# Patient Record
Sex: Female | Born: 1935 | Race: White | Hispanic: No | Smoking: Never smoker
Health system: Southern US, Community
[De-identification: ages and names within clinical notes are randomized; demographics above are authoritative.]

## PROBLEM LIST (undated history)

## (undated) DIAGNOSIS — Z923 Personal history of irradiation: Secondary | ICD-10-CM

## (undated) DIAGNOSIS — I509 Heart failure, unspecified: Secondary | ICD-10-CM

## (undated) DIAGNOSIS — I4891 Unspecified atrial fibrillation: Secondary | ICD-10-CM

## (undated) DIAGNOSIS — T4145XA Adverse effect of unspecified anesthetic, initial encounter: Secondary | ICD-10-CM

## (undated) DIAGNOSIS — C801 Malignant (primary) neoplasm, unspecified: Secondary | ICD-10-CM

## (undated) DIAGNOSIS — I10 Essential (primary) hypertension: Secondary | ICD-10-CM

## (undated) DIAGNOSIS — Z8669 Personal history of other diseases of the nervous system and sense organs: Secondary | ICD-10-CM

## (undated) DIAGNOSIS — R0602 Shortness of breath: Secondary | ICD-10-CM

## (undated) DIAGNOSIS — Z87442 Personal history of urinary calculi: Secondary | ICD-10-CM

## (undated) DIAGNOSIS — Z95828 Presence of other vascular implants and grafts: Secondary | ICD-10-CM

## (undated) DIAGNOSIS — G4730 Sleep apnea, unspecified: Secondary | ICD-10-CM

## (undated) DIAGNOSIS — D49519 Neoplasm of unspecified behavior of unspecified kidney: Secondary | ICD-10-CM

## (undated) DIAGNOSIS — T8859XA Other complications of anesthesia, initial encounter: Secondary | ICD-10-CM

## (undated) DIAGNOSIS — G629 Polyneuropathy, unspecified: Secondary | ICD-10-CM

## (undated) DIAGNOSIS — R351 Nocturia: Secondary | ICD-10-CM

## (undated) DIAGNOSIS — E119 Type 2 diabetes mellitus without complications: Secondary | ICD-10-CM

## (undated) DIAGNOSIS — M1990 Unspecified osteoarthritis, unspecified site: Secondary | ICD-10-CM

## (undated) HISTORY — DX: Malignant (primary) neoplasm, unspecified: C80.1

## (undated) HISTORY — DX: Essential (primary) hypertension: I10

## (undated) HISTORY — PX: REPLACEMENT TOTAL KNEE BILATERAL: SUR1225

## (undated) HISTORY — DX: Personal history of other diseases of the nervous system and sense organs: Z86.69

## (undated) HISTORY — DX: Type 2 diabetes mellitus without complications: E11.9

## (undated) HISTORY — DX: Unspecified osteoarthritis, unspecified site: M19.90

## (undated) HISTORY — PX: ANKLE SURGERY: SHX546

## (undated) HISTORY — PX: HERNIA REPAIR: SHX51

## (undated) HISTORY — DX: Unspecified atrial fibrillation: I48.91

## (undated) HISTORY — DX: Neoplasm of unspecified behavior of unspecified kidney: D49.519

## (undated) HISTORY — DX: Personal history of irradiation: Z92.3

---

## 2010-09-21 HISTORY — PX: TUMOR REMOVAL: SHX12

## 2012-12-20 HISTORY — PX: DILATION AND CURETTAGE OF UTERUS: SHX78

## 2013-03-15 HISTORY — PX: ABDOMINAL HYSTERECTOMY: SHX81

## 2013-03-21 DIAGNOSIS — C801 Malignant (primary) neoplasm, unspecified: Secondary | ICD-10-CM

## 2013-03-21 HISTORY — DX: Malignant (primary) neoplasm, unspecified: C80.1

## 2013-04-17 ENCOUNTER — Ambulatory Visit: Payer: Medicare Other | Attending: Gynecology | Admitting: Gynecology

## 2013-04-17 ENCOUNTER — Encounter: Payer: Self-pay | Admitting: Gynecology

## 2013-04-17 VITALS — BP 130/74 | Temp 98.4°F | Resp 20 | Ht 65.55 in | Wt 260.4 lb

## 2013-04-17 DIAGNOSIS — C649 Malignant neoplasm of unspecified kidney, except renal pelvis: Secondary | ICD-10-CM

## 2013-04-17 DIAGNOSIS — I10 Essential (primary) hypertension: Secondary | ICD-10-CM | POA: Insufficient documentation

## 2013-04-17 DIAGNOSIS — Z794 Long term (current) use of insulin: Secondary | ICD-10-CM | POA: Insufficient documentation

## 2013-04-17 DIAGNOSIS — C541 Malignant neoplasm of endometrium: Secondary | ICD-10-CM

## 2013-04-17 DIAGNOSIS — C549 Malignant neoplasm of corpus uteri, unspecified: Secondary | ICD-10-CM | POA: Insufficient documentation

## 2013-04-17 DIAGNOSIS — Z7901 Long term (current) use of anticoagulants: Secondary | ICD-10-CM | POA: Insufficient documentation

## 2013-04-17 DIAGNOSIS — I4891 Unspecified atrial fibrillation: Secondary | ICD-10-CM | POA: Insufficient documentation

## 2013-04-17 DIAGNOSIS — E119 Type 2 diabetes mellitus without complications: Secondary | ICD-10-CM

## 2013-04-17 NOTE — Patient Instructions (Addendum)
We will schedule a PET scan, consultation with medical and radiation oncology.  I will plan on seeing me back in 2 months.

## 2013-04-17 NOTE — Progress Notes (Signed)
Consult Note: Gyn-Onc   Monica Compton 77 y.o. female  Chief Complaint  Patient presents with  . 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  I discussed with the patient and her daughter treatment options including:  #1 observation  #2 completion surgical staging including peritoneal washings, partial omentectomy, removal of the cervix, and possibly lymphadenectomy. Based on those surgical and pathologic findings adjuvant therapy would be individualized.  #3 proceed immediately to treat her as a high-risk patient with carboplatin and Taxol chemotherapy combined with brachytherapy to the cervix. The pros and cons of the surgery choices were discussed at length in the endocrine to proceed with option #3.  Prior to initiating therapy will obtain a PET CT scan to make more certain where not overlooking more extensive disease and use this as a baseline for later evaluation. We will schedule her to see a medical oncologist in the near future and obtain consultation with radiation oncology as well. All the patient's questions are answered. The patient and her daughter voice clear understanding of our rationale and treatment plan. Plan on seeing the patient back again in approximately 2 months in the middle of her chemotherapy.     HPI: 77 year old white female seen in consultation regarding management of a newly diagnosed endometrial cancer. Review of records show the patient had abnormal bleeding for a number of months. Evaluation has included an endometrial biopsy and D&C neither of which revealed endometrial cancer. However it is noted in the patient's operative note that the [facility] specimen did not show any definitive  endometrial epithelium. Ultimately, because of heavy bleeding, the patient underwent surgery on 03/15/2013. Procedure was a laparoscopic supracervical hysterectomy which required morcellation to remove the uterus. Final pathology revealed a 225 g specimen which was morcellated. It is the pathologist opinion that the aggregate tumor size is approximately 6 cm. Currently depth of invasion could not be determined the tubes and ovaries appeared not to be involved with cancer.  The patient's postoperative course was uncomplicated.  It is noted the patient is a number of medical problems listed below.  Review of Systems:10 point review of systems is negative except as noted in interval history.   Vitals: Blood pressure 130/74, temperature 98.4 F (36.9 C), temperature source Oral, resp. rate 20, height 5' 5.55" (1.665 m), weight 260 lb 6.4 oz (118.117 kg).  Physical Exam: General : The patient is a healthy woman in no acute distress.  HEENT: normocephalic, extraoccular movements normal; neck is supple without thyromegally  Lynphnodes: Supraclavicular and inguinal nodes not enlarged  Abdomen: Soft, non-tender, no ascites, no organomegally, no masses, no hernias , the abdomen is obese and has a well-healed transverse incision in the epigastrium allegedly for a hernia repair. Pelvic:  EGBUS: Normal female  Vagina: Normal, no lesions  Urethra and Bladder: Normal, non-tender  Cervix: Difficult to visualize because the patient's habitus but seems to be normal. Uterus: Surgically absent  Bi-manual examination: Non-tender; no adenxal masses or nodularity  Rectal: normal sphincter tone, no masses, no blood  Lower extremities: No edema or varicosities. Normal range of motion      Allergies  Allergen Reactions  . Statins Itching    Past Medical History  Diagnosis Date  .  Hypertension   . Diabetes mellitus without complication   . Kidney tumor   . Arthritis   . A-fib   . Cancer      Endometrial cancer  . Hx of migraines     Past Surgical History  Procedure Laterality Date  . Tumor removal  2012    Kidney  . Replacement total knee bilateral      10 yrs ago, 29 yrs ago  . Ankle surgery      Current Outpatient Prescriptions  Medication Sig Dispense Refill  . Cholecalciferol (VITAMIN D-3) 1000 UNITS CAPS Take by mouth daily.      . Cyanocobalamin (VITAMIN B-12 PO) Take 1 tablet by mouth daily.      . digoxin (LANOXIN) 0.125 MG tablet Take 0.125 mg by mouth daily. Patients takes on Monday, Wednesday and friday      . ferrous sulfate 325 (65 FE) MG tablet Take 325 mg by mouth daily with breakfast.      . fish oil-omega-3 fatty acids 1000 MG capsule Take 2 g by mouth daily.      Marland Kitchen FOLIC ACID PO Take 1 tablet by mouth daily.      . furosemide (LASIX) 80 MG tablet Take 80 mg by mouth. 1 1/2 tablet daily      . gabapentin (NEURONTIN) 300 MG capsule Take 300 mg by mouth every morning.      . Insulin Glargine (LANTUS [facility]) Inject into the skin. 51 units in the a.m and 40 units in the p.m.      . Insulin Lispro, Human, (HUMALOG [place]) Inject into the skin. 8 units in the a.m.  and 9 units in the p.m.      Marland Kitchen potassium chloride (K-DUR) 10 MEQ tablet Take 10 mEq by mouth 2 (two) times daily.      . rosuvastatin (CRESTOR) 10 MG tablet Take 10 mg by mouth daily.      . traMADol (ULTRAM) 50 MG tablet Take 50 mg by mouth every 6 (six) hours as needed for pain.      . verapamil (CALAN-SR) 180 MG CR tablet Take 180 mg by mouth 2 (two) times daily.      . vitamin C (ASCORBIC ACID) 500 MG tablet Take 500 mg by mouth daily.      Marland Kitchen warfarin (COUMADIN) 5 MG tablet Take 7.5 mg by mouth daily. On Tuesday and Thursday patient takes  1/2 tablet. On Monday, Wednesday and Friday patient takes 7.5mg        No current facility-administered medications for this visit.    History   Social History  . Marital Status: Legally Separated    Spouse Name: N/A    Number of Children: N/A  . Years of  Education: N/A   Occupational History  . Not on file.   Social History Main Topics  . Smoking status: Never Smoker   . Smokeless tobacco: Not on file  . Alcohol Use: No  . Drug Use: No  . Sexually Active: No   Other Topics Concern  . Not on file   Social History Narrative  . No narrative on file    Family History  Problem Relation Age of Onset  . Diabetes Mother   . Stroke Mother   . Atrial fibrillation Mother   . Hypertension Mother   . Diabetes Maternal Grandmother   . Heart attack Father       Jeannette Corpus, MD 04/17/2013, 4:47 PM

## 2013-04-18 ENCOUNTER — Telehealth: Payer: Self-pay | Admitting: Gynecologic Oncology

## 2013-04-18 NOTE — Telephone Encounter (Signed)
Patient informed of upcoming appt with Dr. Roselind Messier on July 31 at 12:30pm.  Verbalizing understanding.  Instructed to call for any needs.  All upcoming appts reviewed.

## 2013-04-19 ENCOUNTER — Encounter: Payer: Self-pay | Admitting: Radiation Oncology

## 2013-04-19 DIAGNOSIS — C801 Malignant (primary) neoplasm, unspecified: Secondary | ICD-10-CM | POA: Insufficient documentation

## 2013-04-20 ENCOUNTER — Ambulatory Visit
Admission: RE | Admit: 2013-04-20 | Discharge: 2013-04-20 | Disposition: A | Discharge status: Home / Self Care | Payer: Medicare Other | Source: Ambulatory Visit | Attending: Radiation Oncology | Admitting: Radiation Oncology

## 2013-04-20 ENCOUNTER — Encounter: Payer: Self-pay | Admitting: Radiation Oncology

## 2013-04-20 ENCOUNTER — Telehealth: Payer: Self-pay | Admitting: Oncology

## 2013-04-20 VITALS — BP 157/61 | HR 70 | Temp 97.9°F | Ht 66.5 in | Wt 264.0 lb

## 2013-04-20 DIAGNOSIS — C541 Malignant neoplasm of endometrium: Secondary | ICD-10-CM

## 2013-04-20 DIAGNOSIS — C801 Malignant (primary) neoplasm, unspecified: Secondary | ICD-10-CM

## 2013-04-20 DIAGNOSIS — Z90710 Acquired absence of both cervix and uterus: Secondary | ICD-10-CM | POA: Insufficient documentation

## 2013-04-20 DIAGNOSIS — C549 Malignant neoplasm of corpus uteri, unspecified: Secondary | ICD-10-CM | POA: Insufficient documentation

## 2013-04-20 HISTORY — DX: Heart failure, unspecified: I50.9

## 2013-04-20 HISTORY — DX: Polyneuropathy, unspecified: G62.9

## 2013-04-20 MED ORDER — RADIAPLEXRX EX GEL: Freq: Once | CUTANEOUS | Status: DC

## 2013-04-20 NOTE — Telephone Encounter (Signed)
CALLED PT TO GAVE NP APPT 08/05 @ 12. PT STATED SHE WOULD NEED A Monday APPT DUE TO TRANSPORTATION ISSUES. MESSAGE SENT TO DR. Welton Flakes.

## 2013-04-20 NOTE — Progress Notes (Signed)
Radiation Oncology         [phone] ________________________________  Initial outpatient Consultation  Name: Monica Compton MRN: [id number]  Date: 04/20/2013  DOB: December 03, 1935  CC:No primary provider on file.  Clarke-Pearson, Reuel Boom ,Raye Sorrow, MD  REFERRING PHYSICIAN: De Blanch *  DIAGNOSIS: Endometrioid adenocarcinoma, FIGO grade 1  HISTORY OF PRESENT ILLNESS::Monica Compton is a 77 y.o. female who is seen out courtesy of Dr. Darlyn Read for an opinion concerning radiation therapy as part of management of the patient's recently diagnosed endometrial cancer.Review of records show the patient had abnormal bleeding for a number of months. Evaluation has included an endometrial biopsy and D&C neither of which revealed endometrial cancer. However it is noted in the patient's operative note that the [facility] specimen did not show any definitive endometrial epithelium. Ultimately, because of heavy bleeding, the patient underwent surgery on 03/15/2013. Procedure was a laparoscopic supracervical hysterectomy which required morcellation to remove the uterus. Intraoperatively the patient was noted to have adhesions along the anterior vaginal wall.  Final pathology revealed a 225 g specimen which was morcellated. It was the pathologist's opinion that the aggregate tumor size is approximately 6 cm.  depth of invasion could not be determined the tubes and ovaries appeared not to be involved with cancer. The patient did well postoperatively. The findings of endometrial cancer were unexpected and therefore a cancer operation was not performed as above. Patient was referred to gynecologic oncology. Since the uterus was morcellated, the patient was felt to be at high risk for intraperitoneal recurrence and consideration for adjuvant chemotherapy was recommended. Was also recommended patient be considered for intracavitary brachytherapy treatments given the risk for cervical  recurrence.   PREVIOUS RADIATION THERAPY: No  PAST MEDICAL HISTORY:  has a past medical history of Hypertension; Diabetes mellitus without complication; Kidney tumor; Arthritis; A-fib; Cancer (03/2013); migraines; CHF (congestive heart failure); and Neuropathy.    PAST SURGICAL HISTORY: Past Surgical History  Procedure Laterality Date  . Tumor removal  2012    Kidney  . Replacement total knee bilateral      10 yrs ago, 29 yrs ago  . Ankle surgery Left   . Abdominal hysterectomy  03/15/2013  . Dilation and curettage of uterus  12/2012  . Hernia repair      x2    FAMILY HISTORY: family history includes Atrial fibrillation in her mother; Diabetes in her maternal grandmother and mother; Heart attack in her father; Hypertension in her mother; and Stroke in her mother.  SOCIAL HISTORY:  reports that she has never smoked. She has never used smokeless tobacco. She reports that she does not drink alcohol or use illicit drugs.  ALLERGIES: Statins  MEDICATIONS:  Current Outpatient Prescriptions  Medication Sig Dispense Refill  . Cholecalciferol (VITAMIN D-3) 1000 UNITS CAPS Take by mouth daily.      . Cyanocobalamin (VITAMIN B-12 PO) Take 1 tablet by mouth daily.      . digoxin (LANOXIN) 0.125 MG tablet Take 0.125 mg by mouth daily. Patients takes on Monday, Wednesday and friday      . ferrous sulfate 325 (65 FE) MG tablet Take 325 mg by mouth daily with breakfast.      . fish oil-omega-3 fatty acids 1000 MG capsule Take 2 g by mouth daily.      Marland Kitchen FOLIC ACID PO Take 1 tablet by mouth daily.      . furosemide (LASIX) 80 MG tablet Take 80 mg by mouth. 1 1/2 tablet daily      .  gabapentin (NEURONTIN) 300 MG capsule Take 300 mg by mouth every morning.      . Insulin Glargine (LANTUS [place]) Inject into the skin. 51 units in the a.m and 40 units in the p.m.      . Insulin Lispro, Human, (HUMALOG [place]) Inject into the skin. 8 units in the a.m.  and 9 units in the p.m.      Marland Kitchen potassium chloride (K-DUR)  10 MEQ tablet Take 10 mEq by mouth 2 (two) times daily.      . rosuvastatin (CRESTOR) 10 MG tablet Take 10 mg by mouth daily.      . traMADol (ULTRAM) 50 MG tablet Take 50 mg by mouth every 6 (six) hours as needed for pain.      . verapamil (CALAN-SR) 180 MG CR tablet Take 180 mg by mouth 2 (two) times daily.      . vitamin C (ASCORBIC ACID) 500 MG tablet Take 500 mg by mouth daily.      Marland Kitchen warfarin (COUMADIN) 5 MG tablet Take 7.5 mg by mouth daily. On Tuesday and Thursday patient takes  1/2 tablet. On Monday, Wednesday and Friday patient takes 7.5mg        No current facility-administered medications for this encounter.    REVIEW OF SYSTEMS:  A 15 point review of systems is documented in the electronic medical record. This was obtained by the nursing staff. However, I reviewed this with the patient to discuss relevant findings and make appropriate changes. The patient presented with a several month history of vaginal bleeding as above. She denies any pain in the pelvis area. .she denies any urination difficulties or bowel complaints since her surgery.   PHYSICAL EXAM:  height is 5' 6.5" (1.689 m) and weight is 264 lb (119.75 kg). Her temperature is 97.9 F (36.6 C). Her blood pressure is 157/61 and her pulse is 70.   BP 157/61  Pulse 70  Temp(Src) 97.9 F (36.6 C)  Ht 5' 6.5" (1.689 m)  Wt 264 lb (119.75 kg)  BMI 41.98 kg/m2  General Appearance:    Alert, cooperative, no distress, appears stated age,  accompanied by her daughter on evaluation today who is healthcare power of attorney   Head:    Normocephalic, without obvious abnormality, atraumatic  Eyes:    PERRL, conjunctiva/corneas clear, EOM's intact       Nose:   Nares normal, septum midline, mucosa normal, no drainage    or sinus tenderness  Throat:   Lips, mucosa, and tongue normal; gums normal  Neck:   Supple, symmetrical, trachea midline, no adenopathy;    thyroid:  no enlargement/tenderness/nodules; no carotid   bruit or JVD   Back:     Symmetric, no curvature, ROM normal, no CVA tenderness  Lungs:     Clear to auscultation bilaterally, respirations unlabored  Chest Wall:    No tenderness or deformity   Heart:    Regular rate and rhythm at this time. Patient does have history of chronic atrial fibrillation      Abdomen:     Soft, non-tender, bowel sounds active all four quadrants,    no masses, no organomegaly, scar from prior surgery   Genitalia:    deferred until simulation and planning day   Rectal:    deferred until simulation and planning day     Extremities:   Extremities normal, atraumatic, some edema in the ankle and foot areas   Pulses:   2+ and symmetric all extremities  Skin:  Skin color, texture, turgor normal, no rashes or lesions  Lymph nodes:   Cervical, supraclavicular, and axillary nodes normal  Neurologic:  normal strength      LABORATORY DATA:  No results found for this basename: WBC, HGB, HCT, MCV, PLT   No results found for this basename: NA, K, CL, CO2   No results found for this basename: ALT, AST, GGT, ALKPHOS, BILITOT     RADIOGRAPHY: PET scan pending    IMPRESSION: Grade 1 endometrial adenocarcinoma found unexpectedly at the time of supracervical hysterectomy for severe uterine bleeding.  A cancer operation was not performed since this was found unexpectedly. The uterus did undergo morcellation which would put her at risk for intraperitoneal recurrence. Patient will see medical oncology for consideration for adjuvant chemotherapy. Patient would appear to be at risk for recurrence along the cervical area. The pathologic margins along the cervical area from her hysterectomy cannot be determined.  In light of these findings I would recommend intracavitary brachytherapy treatments.  I discussed low dose rate cesium treatment versus high-dose rate iridium 192 treatments. Patient wishes to pursue high-dose rate treatments and to avoid a lengthy admission with low dose rate radiation  treatments.  Patient understands that she will likely undergo anesthesia for each application given her body habitus. I would anticipate that the patient may be treated with a short tandem placed within the cervix region along with a ring applicator along the proximal vagina.    PLAN: Patient will be evaluated by medical oncology in the near future. In light of the patient's age and medical issues I am unsure whether she could tolerate adjuvant chemotherapy at the same time as weekly brachytherapy treatments.  We may need to consider "sandwich therapy" with 3 cycles of chemotherapy followed by Weekly HDR radiation applications  and then 3 additional cycles of chemotherapy.   I spent 60 minutes minutes face to face with the patient and more than 50% of that time was spent in counseling and/or coordination of care.   ------------------------------------------------  -----------------------------------  Billie Lade, PhD, MD

## 2013-04-20 NOTE — Progress Notes (Signed)
GYN Location of Tumor / Histology: endometrium  Patient presented with abnormal bleeding for a number of months Evaluation has included an endometrial biopsy and D&C neither of which revealed endometrial cancer. However it is noted in the patient's operative note that the [facility] specimen did not show any definitive endometrial epithelium. Ultimately, because of heavy bleeding, the patient underwent surgery on 03/15/2013. Procedure was a laparoscopic supracervical hysterectomy which required morcellation to remove the uterus. Final pathology revealed a 225 g specimen which was morcellated. It is the pathologist opinion that the aggregate tumor size is approximately 6 cm. Currently depth of invasion could not be determined the tubes and ovaries appeared not to be involved with cancer.  Past/Anticipated interventions by medical oncology, if any: proceed immediately to treat her as a high-risk patient with carboplatin and Taxol chemotherapy combined with brachytherapy to the cervix.  Weight changes, if any: None noted  Bowel/Bladder complaints, if any: Nodes not enlarged, bladder normal and nontender, habitus seems to be normal bue difficulty to visualize  Nausea/Vomiting, if any: None noted  Pain issues, if any:  None noted  SAFETY ISSUES:  Prior radiation? NO  Pacemaker/ICD? NO  Possible current pregnancy? NO  Is the patient on methotrexate? NO  Current Complaints / other details:  77 year old female. Legally seperated  Grade 1 endometrial cancer status post supracervical hysterectomy with morcellation  AX: statins

## 2013-04-20 NOTE — Progress Notes (Addendum)
Monica Compton here with her daughter for consult for endometrial cancer.  She denies pain.  She does have "nervous nausea."  She denies any vaginal discharge. She is seeing Dr. Welton Flakes and is not sure when she will start chemotherapy.

## 2013-04-20 NOTE — Progress Notes (Signed)
Please see the Nurse Progress Note in the MD Initial Consult Encounter for this patient. 

## 2013-04-21 ENCOUNTER — Telehealth: Payer: Self-pay | Admitting: Gynecologic Oncology

## 2013-04-21 ENCOUNTER — Telehealth: Payer: Self-pay | Admitting: Oncology

## 2013-04-21 ENCOUNTER — Encounter (HOSPITAL_COMMUNITY)
Admission: RE | Admit: 2013-04-21 | Discharge: 2013-04-21 | Disposition: A | Discharge status: Home / Self Care | Payer: Medicare Other | Source: Ambulatory Visit | Attending: Gynecologic Oncology | Admitting: Gynecologic Oncology

## 2013-04-21 DIAGNOSIS — C541 Malignant neoplasm of endometrium: Secondary | ICD-10-CM

## 2013-04-21 DIAGNOSIS — Z90710 Acquired absence of both cervix and uterus: Secondary | ICD-10-CM | POA: Insufficient documentation

## 2013-04-21 DIAGNOSIS — C549 Malignant neoplasm of corpus uteri, unspecified: Secondary | ICD-10-CM | POA: Insufficient documentation

## 2013-04-21 MED ORDER — FLUDEOXYGLUCOSE F - 18 (FDG) INJECTION
18.3000 | Freq: Once | INTRAVENOUS | Status: AC | PRN
  Administered 2013-04-21: 18.3 via INTRAVENOUS

## 2013-04-21 NOTE — Telephone Encounter (Signed)
C/D 04/21/13 for appt. 04/25/13

## 2013-04-21 NOTE — Telephone Encounter (Signed)
Daughter notified of PET scan results.  Message left for the patient at home with the results.  Instructed to call for any concerns.

## 2013-04-24 ENCOUNTER — Other Ambulatory Visit: Payer: Self-pay | Admitting: Medical Oncology

## 2013-04-24 DIAGNOSIS — C541 Malignant neoplasm of endometrium: Secondary | ICD-10-CM

## 2013-04-25 ENCOUNTER — Other Ambulatory Visit: Payer: Medicaid Other | Admitting: Lab

## 2013-04-25 ENCOUNTER — Ambulatory Visit (HOSPITAL_BASED_OUTPATIENT_CLINIC_OR_DEPARTMENT_OTHER): Payer: Medicare Other | Admitting: Oncology

## 2013-04-25 ENCOUNTER — Telehealth: Payer: Self-pay | Admitting: Oncology

## 2013-04-25 ENCOUNTER — Ambulatory Visit: Payer: Self-pay

## 2013-04-25 ENCOUNTER — Other Ambulatory Visit (HOSPITAL_BASED_OUTPATIENT_CLINIC_OR_DEPARTMENT_OTHER): Payer: Medicare Other | Admitting: Lab

## 2013-04-25 VITALS — BP 124/73 | HR 76 | Temp 97.9°F | Resp 20 | Ht 66.5 in | Wt 262.1 lb

## 2013-04-25 DIAGNOSIS — C549 Malignant neoplasm of corpus uteri, unspecified: Secondary | ICD-10-CM

## 2013-04-25 DIAGNOSIS — C541 Malignant neoplasm of endometrium: Secondary | ICD-10-CM

## 2013-04-25 DIAGNOSIS — E119 Type 2 diabetes mellitus without complications: Secondary | ICD-10-CM

## 2013-04-25 DIAGNOSIS — I10 Essential (primary) hypertension: Secondary | ICD-10-CM

## 2013-04-25 DIAGNOSIS — C801 Malignant (primary) neoplasm, unspecified: Secondary | ICD-10-CM

## 2013-04-25 LAB — COMPREHENSIVE METABOLIC PANEL (CC13)
AST: 13 U/L (ref 5–34)
Alkaline Phosphatase: 73 U/L (ref 40–150)
Glucose: 128 mg/dl (ref 70–140)
Potassium: 3.7 mEq/L (ref 3.5–5.1)
Sodium: 148 mEq/L — ABNORMAL HIGH (ref 136–145)
Total Bilirubin: 0.5 mg/dL (ref 0.20–1.20)
Total Protein: 6.6 g/dL (ref 6.4–8.3)

## 2013-04-25 LAB — CBC WITH DIFFERENTIAL/PLATELET
EOS%: 1.9 % (ref 0.0–7.0)
Eosinophils Absolute: 0.2 10*3/uL (ref 0.0–0.5)
LYMPH%: 27.3 % (ref 14.0–49.7)
MCH: 27.4 pg (ref 25.1–34.0)
MCHC: 32.6 g/dL (ref 31.5–36.0)
MCV: 83.9 fL (ref 79.5–101.0)
MONO%: 6.4 % (ref 0.0–14.0)
NEUT#: 6.9 10*3/uL — ABNORMAL HIGH (ref 1.5–6.5)
Platelets: 295 10*3/uL (ref 145–400)
RBC: 4.04 10*6/uL (ref 3.70–5.45)
RDW: 18.2 % — ABNORMAL HIGH (ref 11.2–14.5)

## 2013-04-25 MED ORDER — LORAZEPAM 0.5 MG PO TABS: 0.5000 mg | ORAL_TABLET | Freq: Four times a day (QID) | ORAL | Status: DC | PRN

## 2013-04-25 MED ORDER — PROCHLORPERAZINE MALEATE 10 MG PO TABS: 10.0000 mg | ORAL_TABLET | Freq: Four times a day (QID) | ORAL | Status: DC | PRN

## 2013-04-25 MED ORDER — LIDOCAINE-PRILOCAINE 2.5-2.5 % EX CREA: TOPICAL_CREAM | CUTANEOUS | Status: DC | PRN

## 2013-04-25 MED ORDER — ONDANSETRON HCL 8 MG PO TABS: 8.0000 mg | ORAL_TABLET | Freq: Two times a day (BID) | ORAL | Status: DC

## 2013-04-25 MED ORDER — DEXAMETHASONE 4 MG PO TABS: 8.0000 mg | ORAL_TABLET | Freq: Two times a day (BID) | ORAL | Status: DC

## 2013-04-26 ENCOUNTER — Encounter: Payer: Self-pay | Admitting: Oncology

## 2013-04-26 ENCOUNTER — Other Ambulatory Visit: Payer: Self-pay | Admitting: Oncology

## 2013-04-26 ENCOUNTER — Other Ambulatory Visit: Payer: Self-pay

## 2013-04-28 NOTE — Addendum Note (Signed)
Encounter addended by: Bernell List on: 04/28/2013 12:02 PM<BR>     Documentation filed: Charges VN

## 2013-05-01 ENCOUNTER — Other Ambulatory Visit: Payer: Medicare Other

## 2013-05-01 ENCOUNTER — Encounter: Payer: Self-pay | Admitting: Oncology

## 2013-05-01 NOTE — Progress Notes (Signed)
Made copies of the patient power of attorney papers for the daughters. During her chemo class

## 2013-05-02 ENCOUNTER — Encounter: Payer: Self-pay | Admitting: Oncology

## 2013-05-03 ENCOUNTER — Encounter: Payer: Self-pay | Admitting: *Deleted

## 2013-05-03 NOTE — Progress Notes (Signed)
[facility] Psychosocial Distress Screening Clinical Social Work  Clinical Social Work was referred by distress screening protocol.  The patient scored a 6 on the Psychosocial Distress Thermometer which indicates moderate distress. Clinical Social Worker called patient to assess for distress and other psychosocial needs. Ms. Resh reports feeling somewhat overwhelmed with starting treatment.  She shared she has three daughters and one son, whom are all very supportive.  When she feels overwhelmed, she visits with her neighbors or family.  CSW encouraged patient to attend the GYN support group to meet other women experiencing the cancer journey. Patient plans to contact CSW with any other questions or concerns.   Clinical Social Worker follow up needed: no  If yes, follow up plan:   Kathrin Penner, MSW, LCSW Clinical Social Worker [facility] [phone]

## 2013-05-04 ENCOUNTER — Encounter (HOSPITAL_COMMUNITY): Payer: Self-pay | Admitting: Pharmacy Technician

## 2013-05-04 ENCOUNTER — Other Ambulatory Visit: Payer: Self-pay | Admitting: Radiology

## 2013-05-08 ENCOUNTER — Ambulatory Visit (HOSPITAL_COMMUNITY)
Admission: RE | Admit: 2013-05-08 | Discharge: 2013-05-08 | Disposition: A | Discharge status: Home / Self Care | Payer: Medicare Other | Source: Ambulatory Visit | Attending: Oncology | Admitting: Oncology

## 2013-05-08 ENCOUNTER — Other Ambulatory Visit: Payer: Self-pay | Admitting: Oncology

## 2013-05-08 ENCOUNTER — Encounter (HOSPITAL_COMMUNITY): Payer: Self-pay

## 2013-05-08 DIAGNOSIS — C541 Malignant neoplasm of endometrium: Secondary | ICD-10-CM

## 2013-05-08 DIAGNOSIS — Z7901 Long term (current) use of anticoagulants: Secondary | ICD-10-CM | POA: Insufficient documentation

## 2013-05-08 DIAGNOSIS — I10 Essential (primary) hypertension: Secondary | ICD-10-CM | POA: Insufficient documentation

## 2013-05-08 DIAGNOSIS — E119 Type 2 diabetes mellitus without complications: Secondary | ICD-10-CM | POA: Insufficient documentation

## 2013-05-08 DIAGNOSIS — C549 Malignant neoplasm of corpus uteri, unspecified: Secondary | ICD-10-CM | POA: Insufficient documentation

## 2013-05-08 LAB — CBC
Hemoglobin: 11.7 g/dL — ABNORMAL LOW (ref 12.0–15.0)
MCH: 27.2 pg (ref 26.0–34.0)
MCHC: 32 g/dL (ref 30.0–36.0)
MCV: 85.1 fL (ref 78.0–100.0)
Platelets: 268 10*3/uL (ref 150–400)
RBC: 4.3 MIL/uL (ref 3.87–5.11)

## 2013-05-08 MED ORDER — MIDAZOLAM HCL 2 MG/2ML IJ SOLN
INTRAMUSCULAR | Status: AC | PRN
  Administered 2013-05-08: 2 mg via INTRAVENOUS

## 2013-05-08 MED ORDER — FENTANYL CITRATE 0.05 MG/ML IJ SOLN
INTRAMUSCULAR | Status: AC | PRN
  Administered 2013-05-08: 100 ug via INTRAVENOUS

## 2013-05-08 MED ORDER — CEFAZOLIN SODIUM-DEXTROSE 2-3 GM-% IV SOLR
2.0000 g | Freq: Once | INTRAVENOUS | Status: AC
  Administered 2013-05-08: 2 g via INTRAVENOUS
  Filled 2013-05-08: qty 50

## 2013-05-08 MED ORDER — HEPARIN SOD (PORK) LOCK FLUSH 100 UNIT/ML IV SOLN: 500.0000 [IU] | Freq: Once | INTRAVENOUS | Status: DC

## 2013-05-08 MED ORDER — MIDAZOLAM HCL 2 MG/2ML IJ SOLN
INTRAMUSCULAR | Status: AC
  Filled 2013-05-08: qty 4

## 2013-05-08 MED ORDER — SODIUM CHLORIDE 0.9 % IV SOLN
Freq: Once | INTRAVENOUS | Status: AC
  Administered 2013-05-08: 20 mL/h via INTRAVENOUS

## 2013-05-08 MED ORDER — FENTANYL CITRATE 0.05 MG/ML IJ SOLN
INTRAMUSCULAR | Status: AC
  Filled 2013-05-08: qty 4

## 2013-05-08 NOTE — Procedures (Signed)
R IJ PowerPort No complication No blood loss. See complete dictation in [facility].

## 2013-05-08 NOTE — H&P (Signed)
Chief Complaint: "I'm here for a port" Referring Physician:Khan HPI: Monica Compton is an 77 y.o. female with newly diagnosed endometrial cancer. She is to begin chemotherapy soon and is scheduled today for portacath. PMHx and meds reviewed. Has been off her Coumadin for several days. Denies recent fevers, chills, infectious symptoms such as cough, cold, dysuria, abd pain. She reports healing very well from her surgery.  Past Medical History:  Past Medical History  Diagnosis Date  . Hypertension   . Diabetes mellitus without complication   . Kidney tumor   . Arthritis   . A-fib   . Cancer 03/2013    Endometrial cancer  . Hx of migraines   . CHF (congestive heart failure)   . Neuropathy     Past Surgical History:  Past Surgical History  Procedure Laterality Date  . Tumor removal  2012    Kidney  . Replacement total knee bilateral      10 yrs ago, 29 yrs ago  . Ankle surgery Left   . Abdominal hysterectomy  03/15/2013  . Dilation and curettage of uterus  12/2012  . Hernia repair      x2    Family History:  Family History  Problem Relation Age of Onset  . Diabetes Mother   . Stroke Mother   . Atrial fibrillation Mother   . Hypertension Mother   . Diabetes Maternal Grandmother   . Heart attack Father     Social History:  reports that she has never smoked. She has never used smokeless tobacco. She reports that she does not drink alcohol or use illicit drugs.  Allergies:  Allergies  Allergen Reactions  . Statins Itching  . Sulfur Hives    Medications:   Medication List    ASK your doctor about these medications       dexamethasone 4 MG tablet  Commonly known as:  DECADRON  Take 2 tablets (8 mg total) by mouth 2 (two) times daily with a meal. Take two times a day starting the day after chemotherapy for 3 days.     digoxin 0.125 MG tablet  Commonly known as:  LANOXIN  Take 0.125 mg by mouth every Monday, Wednesday, and Friday.     ferrous sulfate 325 (65  FE) MG tablet  Take 325 mg by mouth daily with breakfast.     fish oil-omega-3 fatty acids 1000 MG capsule  Take 1 g by mouth 2 (two) times daily.     folic acid 1 MG tablet  Commonly known as:  FOLVITE  Take 1 mg by mouth daily.     furosemide 80 MG tablet  Commonly known as:  LASIX  Take 120 mg by mouth daily.     gabapentin 300 MG capsule  Commonly known as:  NEURONTIN  Take 300 mg by mouth every evening.     insulin glargine 100 UNIT/ML injection  Commonly known as:  LANTUS  Inject 40-51 Units into the skin 2 (two) times daily. 51u in am, 40u in pm     insulin lispro 100 UNIT/ML injection  Commonly known as:  HUMALOG  Inject 8-9 Units into the skin 2 (two) times daily. 8u in am, 9u in pm     lidocaine-prilocaine cream  Commonly known as:  EMLA  Apply topically as needed.     LORazepam 0.5 MG tablet  Commonly known as:  ATIVAN  Take 1 tablet (0.5 mg total) by mouth every 6 (six) hours as needed (Nausea or vomiting).  ondansetron 8 MG tablet  Commonly known as:  ZOFRAN  Take 1 tablet (8 mg total) by mouth 2 (two) times daily. Take two times a day starting the day after chemo for 3 days. Then take two times a day as needed for nausea or vomiting.     potassium chloride 10 MEQ tablet  Commonly known as:  K-DUR  Take 10 mEq by mouth 2 (two) times daily.     prochlorperazine 10 MG tablet  Commonly known as:  COMPAZINE  Take 1 tablet (10 mg total) by mouth every 6 (six) hours as needed (Nausea or vomiting).     rosuvastatin 10 MG tablet  Commonly known as:  CRESTOR  Take 10 mg by mouth daily.     traMADol 50 MG tablet  Commonly known as:  ULTRAM  Take 50 mg by mouth 2 (two) times daily as needed for pain.     verapamil 180 MG CR tablet  Commonly known as:  CALAN-SR  Take 180 mg by mouth 2 (two) times daily.     vitamin B-12 1000 MCG tablet  Commonly known as:  CYANOCOBALAMIN  Take 1,000 mcg by mouth daily.     vitamin C 500 MG tablet  Commonly known as:   ASCORBIC ACID  Take 500 mg by mouth daily.     Vitamin D-3 1000 UNITS Caps  Take 1 capsule by mouth daily.     warfarin 7.5 MG tablet  Commonly known as:  COUMADIN  Take 3.75-7.5 mg by mouth daily. 1 tab daily except 0.5 tab on Tues and Thurs        Please HPI for pertinent positives, otherwise complete 10 system ROS negative.  Physical Exam: Temp: 98.3, HR: 63, RR: 16, BP: 113/96, O2: 96%   General Appearance:  Alert, cooperative, no distress, appears stated age  Head:  Normocephalic, without obvious abnormality, atraumatic  ENT: Unremarkable  Neck: Supple, symmetrical, trachea midline  Lungs:   Clear to auscultation bilaterally, no w/r/r, respirations unlabored without use of accessory muscles.  Chest Wall:  No tenderness or deformity  Heart:  Irregular rate and rhythm, S1, S2 normal, no murmur, rub or gallop.  Abdomen:   Soft, non-tender, non distended.  Neurologic: Normal affect, no gross deficits.   Results for orders placed during the hospital encounter of 05/08/13 (from the past 48 hour(s))  CBC     Status: Abnormal   Collection Time    05/08/13 10:00 AM      Result Value Range   WBC 12.2 (*) 4.0 - 10.5 K/uL   RBC 4.30  3.87 - 5.11 MIL/uL   Hemoglobin 11.7 (*) 12.0 - 15.0 g/dL   HCT 81.1  91.4 - 78.2 %   MCV 85.1  78.0 - 100.0 fL   MCH 27.2  26.0 - 34.0 pg   MCHC 32.0  30.0 - 36.0 g/dL   RDW 95.6 (*) 21.3 - 08.6 %   Platelets 268  150 - 400 K/uL  PROTIME-INR     Status: None   Collection Time    05/08/13 10:00 AM      Result Value Range   Prothrombin Time 14.0  11.6 - 15.2 seconds   INR 1.10  0.00 - 1.49   No results found.  Assessment/Plan Endometrial cancer Discussed port placement, risks, complications, use of sedation Labs reviewed. Consent signed in chart.  Brayton El PA-C 05/08/2013, 10:52 AM

## 2013-05-14 NOTE — Progress Notes (Signed)
[facility]  [phone] [phone]  MEDICAL ONCOLOGY - INITIAL CONSULATION    Referral MD Dr. Emmaline Kluver  Reason for Referral: 77 year old female with grade 1 endometrial cancer diagnosed June 2014  Chief Complaint  Patient presents with  . new patient  :  Endometrial cancer FIGO 1  HPI: patient is a 77 year old female who had a recent diagnosis of endometrial cancer. She had been having abnormal bleeding for a number of months. She underwent an evaluation which included endometrial biopsy and D&C. The patient and on 03/15/2013 underwent microscopic supra-cervical hysterectomy which required morcellation to remove the uterus. The final pathology revealed a 6 cm aggregate tumor size. Of invasion could not be determined the tubes and ovaries appeared not to be involved with cancer. Postop course was uncomplicated. She was seen by Dr. Loree Fee who has recommended adjuvant chemotherapy consisting of Taxol and carboplatinum. She is now seen in medical oncology for discussion of these.    Past Medical History  Diagnosis Date  . Hypertension   . Diabetes mellitus without complication   . Kidney tumor   . Arthritis   . A-fib   . Cancer 03/2013    Endometrial cancer  . Hx of migraines   . CHF (congestive heart failure)   . Neuropathy   :  Past Surgical History  Procedure Laterality Date  . Tumor removal  2012    Kidney  . Replacement total knee bilateral      10 yrs ago, 29 yrs ago  . Ankle surgery Left   . Abdominal hysterectomy  03/15/2013  . Dilation and curettage of uterus  12/2012  . Hernia repair      x2  :  Current Outpatient Prescriptions  Medication Sig Dispense Refill  . Cholecalciferol (VITAMIN D-3) 1000 UNITS CAPS Take 1 capsule by mouth daily.       . digoxin (LANOXIN) 0.125 MG tablet Take 0.125 mg by mouth every Monday, Wednesday, and Friday.       . ferrous sulfate 325 (65 FE) MG tablet Take 325 mg by mouth  daily with breakfast.      . fish oil-omega-3 fatty acids 1000 MG capsule Take 1 g by mouth 2 (two) times daily.       . furosemide (LASIX) 80 MG tablet Take 120 mg by mouth daily.       Marland Kitchen gabapentin (NEURONTIN) 300 MG capsule Take 300 mg by mouth every evening.       . potassium chloride (K-DUR) 10 MEQ tablet Take 10 mEq by mouth 2 (two) times daily.      . rosuvastatin (CRESTOR) 10 MG tablet Take 10 mg by mouth daily.      . traMADol (ULTRAM) 50 MG tablet Take 50 mg by mouth 2 (two) times daily as needed for pain.       . verapamil (CALAN-SR) 180 MG CR tablet Take 180 mg by mouth 2 (two) times daily.      . vitamin C (ASCORBIC ACID) 500 MG tablet Take 500 mg by mouth daily.      Marland Kitchen dexamethasone (DECADRON) 4 MG tablet Take 2 tablets (8 mg total) by mouth 2 (two) times daily with a meal. Take two times a day starting the day after chemotherapy for 3 days.  30 tablet  1  . folic acid (FOLVITE) 1 MG tablet Take 1 mg by mouth daily.      . insulin glargine (LANTUS) 100 UNIT/ML injection Inject 40-51  Units into the skin 2 (two) times daily. 51u in am, 40u in pm      . insulin lispro (HUMALOG) 100 UNIT/ML injection Inject 8-9 Units into the skin 2 (two) times daily. 8u in am, 9u in pm      . lidocaine-prilocaine (EMLA) cream Apply topically as needed.  30 g  6  . LORazepam (ATIVAN) 0.5 MG tablet Take 1 tablet (0.5 mg total) by mouth every 6 (six) hours as needed (Nausea or vomiting).  30 tablet  0  . ondansetron (ZOFRAN) 8 MG tablet Take 1 tablet (8 mg total) by mouth 2 (two) times daily. Take two times a day starting the day after chemo for 3 days. Then take two times a day as needed for nausea or vomiting.  30 tablet  1  . prochlorperazine (COMPAZINE) 10 MG tablet Take 1 tablet (10 mg total) by mouth every 6 (six) hours as needed (Nausea or vomiting).  30 tablet  1  . vitamin B-12 (CYANOCOBALAMIN) 1000 MCG tablet Take 1,000 mcg by mouth daily.      Marland Kitchen warfarin (COUMADIN) 7.5 MG tablet Take 3.75-7.5 mg  by mouth daily. 1 tab daily except 0.5 tab on Tues and Thurs       No current facility-administered medications for this visit.     Allergies  Allergen Reactions  . Statins Itching  . Sulfur Hives  :  Family History  Problem Relation Age of Onset  . Diabetes Mother   . Stroke Mother   . Atrial fibrillation Mother   . Hypertension Mother   . Diabetes Maternal Grandmother   . Heart attack Father   :  History   Social History  . Marital Status: Legally Separated    Spouse Name: N/A    Number of Children: 4  . Years of Education: N/A   Occupational History  . Not on file.   Social History Main Topics  . Smoking status: Never Smoker   . Smokeless tobacco: Never Used  . Alcohol Use: No  . Drug Use: No  . Sexual Activity: No   Other Topics Concern  . Not on file   Social History Narrative  . No narrative on file  :  A comprehensive review of systems was negative.  Exam: @IPVITALS @  General:  well-nourished in no acute distress.  Eyes:  no scleral icterus.  ENT:  There were no oropharyngeal lesions.  Neck was without thyromegaly.  Lymphatics:  Negative cervical, supraclavicular or axillary adenopathy.  Respiratory: lungs were clear bilaterally without wheezing or crackles.  Cardiovascular:  Regular rate and rhythm, S1/S2, without murmur, rub or gallop.  There was no pedal edema.  GI:  abdomen was soft, flat, nontender, nondistended, without organomegaly.  Muscoloskeletal:  no spinal tenderness of palpation of vertebral spine.  Skin exam was without echymosis, petichae.  Neuro exam was nonfocal.  Patient was able to get on and off exam table without assistance.  Gait was normal.  Patient was alerted and oriented.  Attention was good.   Language was appropriate.  Mood was normal without depression.  Speech was not pressured.  Thought content was not tangential.     Lab Results  Component Value Date   WBC 12.2* 05/08/2013   HGB 11.7* 05/08/2013   HCT 36.6 05/08/2013    PLT 268 05/08/2013   GLUCOSE 128 04/25/2013   ALT 12 04/25/2013   AST 13 04/25/2013   NA 148* 04/25/2013   K 3.7 04/25/2013   CREATININE 0.9  04/25/2013   BUN 13.0 04/25/2013   CO2 28 04/25/2013   INR 1.10 05/08/2013    Nm Pet Image Initial (pi) Skull Base To Thigh  04/21/2013   *RADIOLOGY REPORT*  Clinical Data: Initial treatment strategy for endometrial carcinoma.  Endometrial carcinoma  NUCLEAR MEDICINE PET SKULL BASE TO THIGH  Fasting Blood Glucose:  162  Technique:  18.3 mCi F-18 FDG was injected intravenously.   CT data was obtained and used for attenuation correction and anatomic localization only.  (This was not acquired as a diagnostic CT examination.) Additional exam technical data entered  on technologist worksheet.  Comparison: None  Findings:  Neck: No hypermetabolic nodes in the neck.  Chest: No hypermetabolic mediastinal or hilar nodes.  No suspicious pulmonary nodules.  Abdomen / Pelvis:Post hysterectomy. Small common iliac and nodes on the right measure less than 10 mm (image 185).  No hypermetabolic pelvic lymph nodes.   No abnormal hypermetabolic activity within the solid organs.  No evidence of abdominal or pelvic hypermetabolic nodes.  Skeleton:No focal hypermetabolic activity to suggest skeletal metastasis.  IMPRESSION: No evidence of local endometrial cancer metastasis or distant metastasis.   Original Report Authenticated By: Genevive Bi, M.D.   Ir Fluoro Guide Cv Line Right  05/08/2013   *RADIOLOGY REPORT*  Clinical Data:Endometrial carcinoma, needs access for chemotherapy.  TUNNELED PORT CATHETER PLACEMENT WITH ULTRASOUND AND FLUOROSCOPIC GUIDANCE  Technique: The procedure, risks, benefits, and alternatives were explained to the patient.  Questions regarding the procedure were encouraged and answered.  The patient understands and consents to the procedure.  As antibiotic prophylaxis, cefazolin 2 grams was ordered pre- procedure and administered intravenously within one hour of incision.   Patency of the right IJ vein was confirmed with ultrasound with image documentation. An appropriate skin site was determined. Skin site was marked. Region was prepped using maximum barrier technique including cap and mask, sterile gown, sterile gloves, large sterile sheet, and Chlorhexidine   as cutaneous antisepsis.   The region was infiltrated locally with 1% lidocaine.  Intravenous Fentanyl and Versed were administered as conscious sedation during continuous cardiorespiratory monitoring by the radiology RN, with a total moderate sedation time of 20 minutes.  Under real-time ultrasound guidance, the right IJ vein was accessed with a 21 gauge micropuncture needle; the needle tip within the vein was confirmed with ultrasound image documentation.   Needle was exchanged over a 018 guidewire for transitional dilator which allowed passage of the [facility] wire into the IVC. Over this, the transitional dilator was exchanged for a 5 [place] MPA catheter. A small incision was made on the right anterior chest wall and a subcutaneous pocket fashioned. The power-injectable port was positioned and its catheter tunneled to the right IJ dermatotomy site. The MPA catheter was exchanged over an Amplatz wire for a peel-away sheath, through which the port catheter, which had been trimmed to the appropriate length, was advanced and positioned under fluoroscopy with its tip at the cavoatrial junction. Spot chest radiograph  confirms good catheter position and no pneumothorax. The pocket was closed with deep interrupted and subcuticular continuous 3-0 Monocryl sutures. The port was  flushed per protocol. The incisions were covered with Dermabond then covered with a sterile dressing. No immediate complication.  Fluoroscopy time: 6 seconds  IMPRESSION Technically successful right IJ power-injectable port catheter placement. Ready for routine use.   Original Report Authenticated By: D. Andria Rhein, MD   Ir US Guide Vasc Access  Right  05/08/2013   *RADIOLOGY REPORT*  Clinical  Data:Endometrial carcinoma, needs access for chemotherapy.  TUNNELED PORT CATHETER PLACEMENT WITH ULTRASOUND AND FLUOROSCOPIC GUIDANCE  Technique: The procedure, risks, benefits, and alternatives were explained to the patient.  Questions regarding the procedure were encouraged and answered.  The patient understands and consents to the procedure.  As antibiotic prophylaxis, cefazolin 2 grams was ordered pre- procedure and administered intravenously within one hour of incision.  Patency of the right IJ vein was confirmed with ultrasound with image documentation. An appropriate skin site was determined. Skin site was marked. Region was prepped using maximum barrier technique including cap and mask, sterile gown, sterile gloves, large sterile sheet, and Chlorhexidine   as cutaneous antisepsis.   The region was infiltrated locally with 1% lidocaine.  Intravenous Fentanyl and Versed were administered as conscious sedation during continuous cardiorespiratory monitoring by the radiology RN, with a total moderate sedation time of 20 minutes.  Under real-time ultrasound guidance, the right IJ vein was accessed with a 21 gauge micropuncture needle; the needle tip within the vein was confirmed with ultrasound image documentation.   Needle was exchanged over a 018 guidewire for transitional dilator which allowed passage of the [facility] wire into the IVC. Over this, the transitional dilator was exchanged for a 5 [place] MPA catheter. A small incision was made on the right anterior chest wall and a subcutaneous pocket fashioned. The power-injectable port was positioned and its catheter tunneled to the right IJ dermatotomy site. The MPA catheter was exchanged over an Amplatz wire for a peel-away sheath, through which the port catheter, which had been trimmed to the appropriate length, was advanced and positioned under fluoroscopy with its tip at the cavoatrial junction. Spot chest  radiograph  confirms good catheter position and no pneumothorax. The pocket was closed with deep interrupted and subcuticular continuous 3-0 Monocryl sutures. The port was  flushed per protocol. The incisions were covered with Dermabond then covered with a sterile dressing. No immediate complication.  Fluoroscopy time: 6 seconds  IMPRESSION Technically successful right IJ power-injectable port catheter placement. Ready for routine use.   Original Report Authenticated By: D. Andria Rhein, MD    Pathology:  Nm Pet Image Initial (pi) Skull Base To Thigh  04/21/2013   *RADIOLOGY REPORT*  Clinical Data: Initial treatment strategy for endometrial carcinoma.  Endometrial carcinoma  NUCLEAR MEDICINE PET SKULL BASE TO THIGH  Fasting Blood Glucose:  162  Technique:  18.3 mCi F-18 FDG was injected intravenously.   CT data was obtained and used for attenuation correction and anatomic localization only.  (This was not acquired as a diagnostic CT examination.) Additional exam technical data entered  on technologist worksheet.  Comparison: None  Findings:  Neck: No hypermetabolic nodes in the neck.  Chest: No hypermetabolic mediastinal or hilar nodes.  No suspicious pulmonary nodules.  Abdomen / Pelvis:Post hysterectomy. Small common iliac and nodes on the right measure less than 10 mm (image 185).  No hypermetabolic pelvic lymph nodes.   No abnormal hypermetabolic activity within the solid organs.  No evidence of abdominal or pelvic hypermetabolic nodes.  Skeleton:No focal hypermetabolic activity to suggest skeletal metastasis.  IMPRESSION: No evidence of local endometrial cancer metastasis or distant metastasis.   Original Report Authenticated By: Genevive Bi, M.D.   Ir Fluoro Guide Cv Line Right  05/08/2013   *RADIOLOGY REPORT*  Clinical Data:Endometrial carcinoma, needs access for chemotherapy.  TUNNELED PORT CATHETER PLACEMENT WITH ULTRASOUND AND FLUOROSCOPIC GUIDANCE  Technique: The procedure, risks, benefits, and  alternatives were explained to the patient.  Questions regarding the procedure were encouraged and answered.  The patient understands and consents to the procedure.  As antibiotic prophylaxis, cefazolin 2 grams was ordered pre- procedure and administered intravenously within one hour of incision.  Patency of the right IJ vein was confirmed with ultrasound with image documentation. An appropriate skin site was determined. Skin site was marked. Region was prepped using maximum barrier technique including cap and mask, sterile gown, sterile gloves, large sterile sheet, and Chlorhexidine   as cutaneous antisepsis.   The region was infiltrated locally with 1% lidocaine.  Intravenous Fentanyl and Versed were administered as conscious sedation during continuous cardiorespiratory monitoring by the radiology RN, with a total moderate sedation time of 20 minutes.  Under real-time ultrasound guidance, the right IJ vein was accessed with a 21 gauge micropuncture needle; the needle tip within the vein was confirmed with ultrasound image documentation.   Needle was exchanged over a 018 guidewire for transitional dilator which allowed passage of the [facility] wire into the IVC. Over this, the transitional dilator was exchanged for a 5 [place] MPA catheter. A small incision was made on the right anterior chest wall and a subcutaneous pocket fashioned. The power-injectable port was positioned and its catheter tunneled to the right IJ dermatotomy site. The MPA catheter was exchanged over an Amplatz wire for a peel-away sheath, through which the port catheter, which had been trimmed to the appropriate length, was advanced and positioned under fluoroscopy with its tip at the cavoatrial junction. Spot chest radiograph  confirms good catheter position and no pneumothorax. The pocket was closed with deep interrupted and subcuticular continuous 3-0 Monocryl sutures. The port was  flushed per protocol. The incisions were covered with Dermabond  then covered with a sterile dressing. No immediate complication.  Fluoroscopy time: 6 seconds  IMPRESSION Technically successful right IJ power-injectable port catheter placement. Ready for routine use.   Original Report Authenticated By: D. Andria Rhein, MD   Ir US Guide Vasc Access Right  05/08/2013   *RADIOLOGY REPORT*  Clinical Data:Endometrial carcinoma, needs access for chemotherapy.  TUNNELED PORT CATHETER PLACEMENT WITH ULTRASOUND AND FLUOROSCOPIC GUIDANCE  Technique: The procedure, risks, benefits, and alternatives were explained to the patient.  Questions regarding the procedure were encouraged and answered.  The patient understands and consents to the procedure.  As antibiotic prophylaxis, cefazolin 2 grams was ordered pre- procedure and administered intravenously within one hour of incision.  Patency of the right IJ vein was confirmed with ultrasound with image documentation. An appropriate skin site was determined. Skin site was marked. Region was prepped using maximum barrier technique including cap and mask, sterile gown, sterile gloves, large sterile sheet, and Chlorhexidine   as cutaneous antisepsis.   The region was infiltrated locally with 1% lidocaine.  Intravenous Fentanyl and Versed were administered as conscious sedation during continuous cardiorespiratory monitoring by the radiology RN, with a total moderate sedation time of 20 minutes.  Under real-time ultrasound guidance, the right IJ vein was accessed with a 21 gauge micropuncture needle; the needle tip within the vein was confirmed with ultrasound image documentation.   Needle was exchanged over a 018 guidewire for transitional dilator which allowed passage of the [facility] wire into the IVC. Over this, the transitional dilator was exchanged for a 5 [place] MPA catheter. A small incision was made on the right anterior chest wall and a subcutaneous pocket fashioned. The power-injectable port was positioned and its catheter tunneled to the  right IJ dermatotomy site. The MPA catheter was  exchanged over an Amplatz wire for a peel-away sheath, through which the port catheter, which had been trimmed to the appropriate length, was advanced and positioned under fluoroscopy with its tip at the cavoatrial junction. Spot chest radiograph  confirms good catheter position and no pneumothorax. The pocket was closed with deep interrupted and subcuticular continuous 3-0 Monocryl sutures. The port was  flushed per protocol. The incisions were covered with Dermabond then covered with a sterile dressing. No immediate complication.  Fluoroscopy time: 6 seconds  IMPRESSION Technically successful right IJ power-injectable port catheter placement. Ready for routine use.   Original Report Authenticated By: D. Andria Rhein, MD    Assessment and Plan: 77 year old female with new diagnosis of grade 1 endometrial cancer status post supracervical hysterectomy would morcellation. Patient's tumor measured estimate of 6 cm. Dr. Loree Fee felt that patient was at high risk for intraperitoneal recurrence. Because of this he discussed several treatment options including chemotherapy. She is now seen in medical oncology for discussion of this. A total of 6 cycles of Taxol and carboplatinum are planned. Combined with brachii therapy to the cervix. She will initially undergo 3 cycles of chemotherapy with [place] Taxol. She will then be sandwiched with brachii therapy by Dr. Antony Blackbird period and then we will complete another 3 cycles of treatment. Patient will also have PET CT to make certain there is no extensive disease and use this as a baseline for later evaluation. All questions were answered. We discussed risks benefits and side effects of chemotherapy.  #1 patient will need a Port-A-Cath placed.  #2 we will followup on PET/CT results.  #3 I will plan on getting her started on chemotherapy on 05/15/2013  Drue Second, MD Medical/Oncology [facility] [phone] (beeper) [phone] (Office)

## 2013-05-15 ENCOUNTER — Other Ambulatory Visit (HOSPITAL_BASED_OUTPATIENT_CLINIC_OR_DEPARTMENT_OTHER): Payer: Medicare Other

## 2013-05-15 ENCOUNTER — Telehealth: Payer: Self-pay | Admitting: Oncology

## 2013-05-15 ENCOUNTER — Other Ambulatory Visit: Payer: Self-pay

## 2013-05-15 ENCOUNTER — Encounter: Payer: Self-pay | Admitting: Oncology

## 2013-05-15 ENCOUNTER — Ambulatory Visit (HOSPITAL_BASED_OUTPATIENT_CLINIC_OR_DEPARTMENT_OTHER): Payer: Medicare Other | Admitting: Oncology

## 2013-05-15 ENCOUNTER — Ambulatory Visit (HOSPITAL_BASED_OUTPATIENT_CLINIC_OR_DEPARTMENT_OTHER): Payer: Medicare Other

## 2013-05-15 VITALS — BP 125/78 | HR 67 | Temp 97.1°F | Resp 20

## 2013-05-15 VITALS — BP 137/71 | HR 94 | Temp 98.6°F | Resp 20 | Ht 67.0 in | Wt 259.6 lb

## 2013-05-15 DIAGNOSIS — Z5111 Encounter for antineoplastic chemotherapy: Secondary | ICD-10-CM

## 2013-05-15 DIAGNOSIS — C541 Malignant neoplasm of endometrium: Secondary | ICD-10-CM

## 2013-05-15 DIAGNOSIS — C549 Malignant neoplasm of corpus uteri, unspecified: Secondary | ICD-10-CM

## 2013-05-15 DIAGNOSIS — C801 Malignant (primary) neoplasm, unspecified: Secondary | ICD-10-CM

## 2013-05-15 LAB — CBC WITH DIFFERENTIAL/PLATELET
BASO%: 0.2 % (ref 0.0–2.0)
Basophils Absolute: 0 10*3/uL (ref 0.0–0.1)
EOS%: 1.5 % (ref 0.0–7.0)
MCH: 26.4 pg (ref 25.1–34.0)
MCHC: 30.9 g/dL — ABNORMAL LOW (ref 31.5–36.0)
MCV: 85.2 fL (ref 79.5–101.0)
MONO%: 6 % (ref 0.0–14.0)
RBC: 4.25 10*6/uL (ref 3.70–5.45)
RDW: 16.1 % — ABNORMAL HIGH (ref 11.2–14.5)
lymph#: 3 10*3/uL (ref 0.9–3.3)

## 2013-05-15 LAB — COMPREHENSIVE METABOLIC PANEL (CC13)
AST: 14 U/L (ref 5–34)
Albumin: 2.8 g/dL — ABNORMAL LOW (ref 3.5–5.0)
Alkaline Phosphatase: 72 U/L (ref 40–150)
BUN: 13.1 mg/dL (ref 7.0–26.0)
Glucose: 228 mg/dl — ABNORMAL HIGH (ref 70–140)
Potassium: 3.2 mEq/L — ABNORMAL LOW (ref 3.5–5.1)
Sodium: 145 mEq/L (ref 136–145)
Total Bilirubin: 0.53 mg/dL (ref 0.20–1.20)
Total Protein: 6.9 g/dL (ref 6.4–8.3)

## 2013-05-15 MED ORDER — SODIUM CHLORIDE 0.9 % IV SOLN
625.0000 mg | Freq: Once | INTRAVENOUS | Status: AC
  Administered 2013-05-15: 630 mg via INTRAVENOUS
  Filled 2013-05-15: qty 63

## 2013-05-15 MED ORDER — DEXAMETHASONE SODIUM PHOSPHATE 20 MG/5ML IJ SOLN
20.0000 mg | Freq: Once | INTRAMUSCULAR | Status: AC
  Administered 2013-05-15: 20 mg via INTRAVENOUS

## 2013-05-15 MED ORDER — DIPHENHYDRAMINE HCL 50 MG/ML IJ SOLN
50.0000 mg | Freq: Once | INTRAMUSCULAR | Status: AC
  Administered 2013-05-15: 50 mg via INTRAVENOUS

## 2013-05-15 MED ORDER — FAMOTIDINE IN NACL 20-0.9 MG/50ML-% IV SOLN
20.0000 mg | Freq: Once | INTRAVENOUS | Status: AC
  Administered 2013-05-15: 20 mg via INTRAVENOUS

## 2013-05-15 MED ORDER — SODIUM CHLORIDE 0.9 % IJ SOLN
10.0000 mL | INTRAMUSCULAR | Status: DC | PRN
  Filled 2013-05-15: qty 10

## 2013-05-15 MED ORDER — PACLITAXEL CHEMO INJECTION 300 MG/50ML
175.0000 mg/m2 | Freq: Once | INTRAVENOUS | Status: AC
  Administered 2013-05-15: 414 mg via INTRAVENOUS
  Filled 2013-05-15: qty 69

## 2013-05-15 MED ORDER — HEPARIN SOD (PORK) LOCK FLUSH 100 UNIT/ML IV SOLN
500.0000 [IU] | Freq: Once | INTRAVENOUS | Status: AC | PRN
  Filled 2013-05-15: qty 5

## 2013-05-15 MED ORDER — ONDANSETRON 16 MG/50ML IVPB (CHCC)
16.0000 mg | Freq: Once | INTRAVENOUS | Status: AC
  Administered 2013-05-15: 16 mg via INTRAVENOUS

## 2013-05-15 MED ORDER — UNABLE TO FIND: 1.0000 "application " | Freq: Once | Status: DC

## 2013-05-15 MED ORDER — SODIUM CHLORIDE 0.9 % IV SOLN
Freq: Once | INTRAVENOUS | Status: AC
  Administered 2013-05-15: 12:00:00 via INTRAVENOUS

## 2013-05-15 MED ORDER — METHYLPREDNISOLONE SODIUM SUCC 125 MG IJ SOLR
125.0000 mg | Freq: Once | INTRAMUSCULAR | Status: AC | PRN
  Administered 2013-05-15: 125 mg via INTRAVENOUS

## 2013-05-15 NOTE — Patient Instructions (Signed)
[facility] Discharge Instructions for Patients Receiving Chemotherapy  Today you received the following chemotherapy agents Taxol, Carbooplatin  To help prevent nausea and vomiting after your treatment, we encourage you to take your nausea medication Zofran   If you develop nausea and vomiting that is not controlled by your nausea medication, call the clinic.   BELOW ARE SYMPTOMS THAT SHOULD BE REPORTED IMMEDIATELY:  *FEVER GREATER THAN 100.5 F  *CHILLS WITH OR WITHOUT FEVER  NAUSEA AND VOMITING THAT IS NOT CONTROLLED WITH YOUR NAUSEA MEDICATION  *UNUSUAL SHORTNESS OF BREATH  *UNUSUAL BRUISING OR BLEEDING  TENDERNESS IN MOUTH AND THROAT WITH OR WITHOUT PRESENCE OF ULCERS  *URINARY PROBLEMS  *BOWEL PROBLEMS  UNUSUAL RASH Items with * indicate a potential emergency and should be followed up as soon as possible.  Feel free to call the clinic you have any questions or concerns. The clinic phone number is [phone].   Paclitaxel injection What is this medicine? PACLITAXEL (PAK li TAX el) is a chemotherapy drug. It targets fast dividing cells, like cancer cells, and causes these cells to die. This medicine is used to treat ovarian cancer, breast cancer, and other cancers. This medicine may be used for other purposes; ask your health care provider or pharmacist if you have questions. What should I tell my health care provider before I take this medicine? They need to know if you have any of these conditions: -blood disorders -irregular heartbeat -infection (especially a virus infection such as chickenpox, cold sores, or herpes) -liver disease -previous or ongoing radiation therapy -an unusual or allergic reaction to paclitaxel, alcohol, polyoxyethylated castor oil, other chemotherapy agents, other medicines, foods, dyes, or preservatives -pregnant or trying to get pregnant -breast-feeding How should I use this medicine? This drug is given as an infusion  into a vein. It is administered in a hospital or clinic by a specially trained health care professional. Talk to your pediatrician regarding the use of this medicine in children. Special care may be needed. Overdosage: If you think you have taken too much of this medicine contact a poison control center or emergency room at once. NOTE: This medicine is only for you. Do not share this medicine with others. What if I miss a dose? It is important not to miss your dose. Call your doctor or health care professional if you are unable to keep an appointment. What may interact with this medicine? Do not take this medicine with any of the following medications: -disulfiram -metronidazole This medicine may also interact with the following medications: -cyclosporine -dexamethasone -diazepam -ketoconazole -medicines to increase blood counts like filgrastim, pegfilgrastim, sargramostim -other chemotherapy drugs like cisplatin, doxorubicin, epirubicin, etoposide, teniposide, vincristine -quinidine -testosterone -vaccines -verapamil Talk to your doctor or health care professional before taking any of these medicines: -acetaminophen -aspirin -ibuprofen -ketoprofen -naproxen This list may not describe all possible interactions. Give your health care provider a list of all the medicines, herbs, non-prescription drugs, or dietary supplements you use. Also tell them if you smoke, drink alcohol, or use illegal drugs. Some items may interact with your medicine. What should I watch for while using this medicine? Your condition will be monitored carefully while you are receiving this medicine. You will need important blood work done while you are taking this medicine. This drug may make you feel generally unwell. This is not uncommon, as chemotherapy can affect healthy cells as well as cancer cells. Report any side effects. Continue your course of treatment even though you feel  ill unless your doctor tells you  to stop. In some cases, you may be given additional medicines to help with side effects. Follow all directions for their use. Call your doctor or health care professional for advice if you get a fever, chills or sore throat, or other symptoms of a cold or flu. Do not treat yourself. This drug decreases your body's ability to fight infections. Try to avoid being around people who are sick. This medicine may increase your risk to bruise or bleed. Call your doctor or health care professional if you notice any unusual bleeding. Be careful brushing and flossing your teeth or using a toothpick because you may get an infection or bleed more easily. If you have any dental work done, tell your dentist you are receiving this medicine. Avoid taking products that contain aspirin, acetaminophen, ibuprofen, naproxen, or ketoprofen unless instructed by your doctor. These medicines may hide a fever. Do not become pregnant while taking this medicine. Women should inform their doctor if they wish to become pregnant or think they might be pregnant. There is a potential for serious side effects to an unborn child. Talk to your health care professional or pharmacist for more information. Do not breast-feed an infant while taking this medicine. Men are advised not to father a child while receiving this medicine. What side effects may I notice from receiving this medicine? Side effects that you should report to your doctor or health care professional as soon as possible: -allergic reactions like skin rash, itching or hives, swelling of the face, lips, or tongue -low blood counts - This drug may decrease the number of white blood cells, red blood cells and platelets. You may be at increased risk for infections and bleeding. -signs of infection - fever or chills, cough, sore throat, pain or difficulty passing urine -signs of decreased platelets or bleeding - bruising, pinpoint red spots on the skin, black, tarry stools,  nosebleeds -signs of decreased red blood cells - unusually weak or tired, fainting spells, lightheadedness -breathing problems -chest pain -high or low blood pressure -mouth sores -nausea and vomiting -pain, swelling, redness or irritation at the injection site -pain, tingling, numbness in the hands or feet -slow or irregular heartbeat -swelling of the ankle, feet, hands Side effects that usually do not require medical attention (report to your doctor or health care professional if they continue or are bothersome): -bone pain -complete hair loss including hair on your head, underarms, pubic hair, eyebrows, and eyelashes -changes in the color of fingernails -diarrhea -loosening of the fingernails -loss of appetite -muscle or joint pain -red flush to skin -sweating This list may not describe all possible side effects. Call your doctor for medical advice about side effects. You may report side effects to FDA at 1-800-FDA-1088. Where should I keep my medicine? This drug is given in a hospital or clinic and will not be stored at home. NOTE: This sheet is a summary. It may not cover all possible information. If you have questions about this medicine, talk to your doctor, pharmacist, or health care provider.  2012, [employer]. (08/20/2008 11:54:26 AM)  Carboplatin injection What is this medicine? CARBOPLATIN (KAR boe pla tin) is a chemotherapy drug. It targets fast dividing cells, like cancer cells, and causes these cells to die. This medicine is used to treat ovarian cancer and many other cancers. This medicine may be used for other purposes; ask your health care provider or pharmacist if you have questions. What should I tell my  health care provider before I take this medicine? They need to know if you have any of these conditions: -blood disorders -hearing problems -kidney disease -recent or ongoing radiation therapy -an unusual or allergic reaction to carboplatin,  cisplatin, other chemotherapy, other medicines, foods, dyes, or preservatives -pregnant or trying to get pregnant -breast-feeding How should I use this medicine? This drug is usually given as an infusion into a vein. It is administered in a hospital or clinic by a specially trained health care professional. Talk to your pediatrician regarding the use of this medicine in children. Special care may be needed. Overdosage: If you think you have taken too much of this medicine contact a poison control center or emergency room at once. NOTE: This medicine is only for you. Do not share this medicine with others. What if I miss a dose? It is important not to miss a dose. Call your doctor or health care professional if you are unable to keep an appointment. What may interact with this medicine? -medicines for seizures -medicines to increase blood counts like filgrastim, pegfilgrastim, sargramostim -some antibiotics like amikacin, gentamicin, neomycin, streptomycin, tobramycin -vaccines Talk to your doctor or health care professional before taking any of these medicines: -acetaminophen -aspirin -ibuprofen -ketoprofen -naproxen This list may not describe all possible interactions. Give your health care provider a list of all the medicines, herbs, non-prescription drugs, or dietary supplements you use. Also tell them if you smoke, drink alcohol, or use illegal drugs. Some items may interact with your medicine. What should I watch for while using this medicine? Your condition will be monitored carefully while you are receiving this medicine. You will need important blood work done while you are taking this medicine. This drug may make you feel generally unwell. This is not uncommon, as chemotherapy can affect healthy cells as well as cancer cells. Report any side effects. Continue your course of treatment even though you feel ill unless your doctor tells you to stop. In some cases, you may be given  additional medicines to help with side effects. Follow all directions for their use. Call your doctor or health care professional for advice if you get a fever, chills or sore throat, or other symptoms of a cold or flu. Do not treat yourself. This drug decreases your body's ability to fight infections. Try to avoid being around people who are sick. This medicine may increase your risk to bruise or bleed. Call your doctor or health care professional if you notice any unusual bleeding. Be careful brushing and flossing your teeth or using a toothpick because you may get an infection or bleed more easily. If you have any dental work done, tell your dentist you are receiving this medicine. Avoid taking products that contain aspirin, acetaminophen, ibuprofen, naproxen, or ketoprofen unless instructed by your doctor. These medicines may hide a fever. Do not become pregnant while taking this medicine. Women should inform their doctor if they wish to become pregnant or think they might be pregnant. There is a potential for serious side effects to an unborn child. Talk to your health care professional or pharmacist for more information. Do not breast-feed an infant while taking this medicine. What side effects may I notice from receiving this medicine? Side effects that you should report to your doctor or health care professional as soon as possible: -allergic reactions like skin rash, itching or hives, swelling of the face, lips, or tongue -signs of infection - fever or chills, cough, sore throat, pain or difficulty  passing urine -signs of decreased platelets or bleeding - bruising, pinpoint red spots on the skin, black, tarry stools, nosebleeds -signs of decreased red blood cells - unusually weak or tired, fainting spells, lightheadedness -breathing problems -changes in hearing -changes in vision -chest pain -high blood pressure -low blood counts - This drug may decrease the number of white blood cells, red  blood cells and platelets. You may be at increased risk for infections and bleeding. -nausea and vomiting -pain, swelling, redness or irritation at the injection site -pain, tingling, numbness in the hands or feet -problems with balance, talking, walking -trouble passing urine or change in the amount of urine Side effects that usually do not require medical attention (report to your doctor or health care professional if they continue or are bothersome): -hair loss -loss of appetite -metallic taste in the mouth or changes in taste This list may not describe all possible side effects. Call your doctor for medical advice about side effects. You may report side effects to FDA at 1-800-FDA-1088. Where should I keep my medicine? This drug is given in a hospital or clinic and will not be stored at home. NOTE: This sheet is a summary. It may not cover all possible information. If you have questions about this medicine, talk to your doctor, pharmacist, or health care provider.  2012, [employer]. (12/13/2007 2:38:05 PM)

## 2013-05-15 NOTE — Progress Notes (Signed)
OFFICE PROGRESS NOTE  CC**  No PCP Per Patient [address] [place] [address] Dr. Emmaline Kluver  DIAGNOSIS: 77 year old female with grade 1 endometrial cancer diagnosed June 2014   PRIOR THERAPY:  #1 She had been having abnormal bleeding for a number of months. She underwent an evaluation which included endometrial biopsy and D&C. The patient and on 03/15/2013 underwent microscopic supra-cervical hysterectomy which required morcellation to remove the uterus. The final pathology revealed a 6 cm aggregate tumor size. Of invasion could not be determined the tubes and ovaries appeared not to be involved with cancer  #2 She was seen by Dr. Loree Fee who has recommended adjuvant chemotherapy consisting of Taxol and carboplatinum. Patient is here to begin cycle 1 day 1 of Taxol carboplatinum. Total of 3 cycles is planned initially. This will then be followed by radiation therapy and then another 3 cycles of chemotherapy.   CURRENT THERAPY: Cycle 1 day 1 of Taxol carboplatinum adjuvantly  INTERVAL HISTORY: Monica Compton 77 y.o. female returns for followup visit. Overall she's doing well. Her lower extremity edema looks much better. She has no nausea vomiting no fevers chills or night sweats no headaches no shortness of breath no chest pains. Remainder of the 10 point review of systems is negative  MEDICAL HISTORY: Past Medical History  Diagnosis Date  . Hypertension   . Diabetes mellitus without complication   . Kidney tumor   . Arthritis   . A-fib   . Cancer 03/2013    Endometrial cancer  . Hx of migraines   . CHF (congestive heart failure)   . Neuropathy     ALLERGIES:  is allergic to statins and sulfur.  MEDICATIONS:  Current Outpatient Prescriptions  Medication Sig Dispense Refill  . Cholecalciferol (VITAMIN D-3) 1000 UNITS CAPS Take 1 capsule by mouth daily.       . digoxin (LANOXIN) 0.125 MG tablet Take 0.125 mg by mouth every Monday, Wednesday, and  Friday.       . ferrous sulfate 325 (65 FE) MG tablet Take 325 mg by mouth daily with breakfast.      . fish oil-omega-3 fatty acids 1000 MG capsule Take 1 g by mouth 2 (two) times daily.       . folic acid (FOLVITE) 1 MG tablet Take 1 mg by mouth daily.      . furosemide (LASIX) 80 MG tablet Take 120 mg by mouth daily.       Marland Kitchen gabapentin (NEURONTIN) 300 MG capsule Take 300 mg by mouth every evening.       . insulin glargine (LANTUS) 100 UNIT/ML injection Inject 40-51 Units into the skin 2 (two) times daily. 51u in am, 40u in pm      . insulin lispro (HUMALOG) 100 UNIT/ML injection Inject 8-9 Units into the skin 2 (two) times daily. 8u in am, 9u in pm      . lidocaine-prilocaine (EMLA) cream Apply topically as needed.  30 g  6  . potassium chloride (K-DUR) 10 MEQ tablet Take 10 mEq by mouth 2 (two) times daily.      . rosuvastatin (CRESTOR) 10 MG tablet Take 10 mg by mouth daily.      . traMADol (ULTRAM) 50 MG tablet Take 50 mg by mouth 2 (two) times daily as needed for pain.       . verapamil (CALAN-SR) 180 MG CR tablet Take 180 mg by mouth 2 (two) times daily.      Marland Kitchen  vitamin B-12 (CYANOCOBALAMIN) 1000 MCG tablet Take 1,000 mcg by mouth daily.      . vitamin C (ASCORBIC ACID) 500 MG tablet Take 500 mg by mouth daily.      Marland Kitchen warfarin (COUMADIN) 7.5 MG tablet Take 3.75-7.5 mg by mouth daily. 1 tab daily except 0.5 tab on Tues and Thurs      . dexamethasone (DECADRON) 4 MG tablet Take 2 tablets (8 mg total) by mouth 2 (two) times daily with a meal. Take two times a day starting the day after chemotherapy for 3 days.  30 tablet  1  . LORazepam (ATIVAN) 0.5 MG tablet Take 1 tablet (0.5 mg total) by mouth every 6 (six) hours as needed (Nausea or vomiting).  30 tablet  0  . ondansetron (ZOFRAN) 8 MG tablet Take 1 tablet (8 mg total) by mouth 2 (two) times daily. Take two times a day starting the day after chemo for 3 days. Then take two times a day as needed for nausea or vomiting.  30 tablet  1  .  prochlorperazine (COMPAZINE) 10 MG tablet Take 1 tablet (10 mg total) by mouth every 6 (six) hours as needed (Nausea or vomiting).  30 tablet  1  . UNABLE TO FIND Apply 1 application topically once. Med Name: cranial prosthesis  1 application  0   No current facility-administered medications for this visit.    SURGICAL HISTORY:  Past Surgical History  Procedure Laterality Date  . Tumor removal  2012    Kidney  . Replacement total knee bilateral      10 yrs ago, 29 yrs ago  . Ankle surgery Left   . Abdominal hysterectomy  03/15/2013  . Dilation and curettage of uterus  12/2012  . Hernia repair      x2    REVIEW OF SYSTEMS:  Pertinent items are noted in HPI.   HEALTH MAINTENANCE:  PHYSICAL EXAMINATION: Blood pressure 137/71, pulse 94, temperature 98.6 F (37 C), temperature source Oral, resp. rate 20, height 5\' 7"  (1.702 m), weight 259 lb 9.6 oz (117.754 kg). Body mass index is 40.65 kg/(m^2). ECOG PERFORMANCE STATUS: 1 - Symptomatic but completely ambulatory   General appearance: alert, cooperative and appears older than stated age Resp: clear to auscultation bilaterally Cardio: regular rate and rhythm GI: soft, non-tender; bowel sounds normal; no masses,  no organomegaly Extremities: edema +2 Neurologic: Grossly normal   LABORATORY DATA: Lab Results  Component Value Date   WBC 10.6* 05/15/2013   HGB 11.2* 05/15/2013   HCT 36.2 05/15/2013   MCV 85.2 05/15/2013   PLT 256 05/15/2013      Chemistry      Component Value Date/Time   NA 148* 04/25/2013 1259   K 3.7 04/25/2013 1259   CO2 28 04/25/2013 1259   BUN 13.0 04/25/2013 1259   CREATININE 0.9 04/25/2013 1259      Component Value Date/Time   CALCIUM 9.0 04/25/2013 1259   ALKPHOS 73 04/25/2013 1259   AST 13 04/25/2013 1259   ALT 12 04/25/2013 1259   BILITOT 0.50 04/25/2013 1259       RADIOGRAPHIC STUDIES:  Nm Pet Image Initial (pi) Skull Base To Thigh  04/21/2013   *RADIOLOGY REPORT*  Clinical Data: Initial treatment strategy  for endometrial carcinoma.  Endometrial carcinoma  NUCLEAR MEDICINE PET SKULL BASE TO THIGH  Fasting Blood Glucose:  162  Technique:  18.3 mCi F-18 FDG was injected intravenously.   CT data was obtained and used for attenuation correction  and anatomic localization only.  (This was not acquired as a diagnostic CT examination.) Additional exam technical data entered  on technologist worksheet.  Comparison: None  Findings:  Neck: No hypermetabolic nodes in the neck.  Chest: No hypermetabolic mediastinal or hilar nodes.  No suspicious pulmonary nodules.  Abdomen / Pelvis:Post hysterectomy. Small common iliac and nodes on the right measure less than 10 mm (image 185).  No hypermetabolic pelvic lymph nodes.   No abnormal hypermetabolic activity within the solid organs.  No evidence of abdominal or pelvic hypermetabolic nodes.  Skeleton:No focal hypermetabolic activity to suggest skeletal metastasis.  IMPRESSION: No evidence of local endometrial cancer metastasis or distant metastasis.   Original Report Authenticated By: Genevive Bi, M.D.   Ir Fluoro Guide Cv Line Right  05/08/2013   *RADIOLOGY REPORT*  Clinical Data:Endometrial carcinoma, needs access for chemotherapy.  TUNNELED PORT CATHETER PLACEMENT WITH ULTRASOUND AND FLUOROSCOPIC GUIDANCE  Technique: The procedure, risks, benefits, and alternatives were explained to the patient.  Questions regarding the procedure were encouraged and answered.  The patient understands and consents to the procedure.  As antibiotic prophylaxis, cefazolin 2 grams was ordered pre- procedure and administered intravenously within one hour of incision.  Patency of the right IJ vein was confirmed with ultrasound with image documentation. An appropriate skin site was determined. Skin site was marked. Region was prepped using maximum barrier technique including cap and mask, sterile gown, sterile gloves, large sterile sheet, and Chlorhexidine   as cutaneous antisepsis.   The region was  infiltrated locally with 1% lidocaine.  Intravenous Fentanyl and Versed were administered as conscious sedation during continuous cardiorespiratory monitoring by the radiology RN, with a total moderate sedation time of 20 minutes.  Under real-time ultrasound guidance, the right IJ vein was accessed with a 21 gauge micropuncture needle; the needle tip within the vein was confirmed with ultrasound image documentation.   Needle was exchanged over a 018 guidewire for transitional dilator which allowed passage of the [facility] wire into the IVC. Over this, the transitional dilator was exchanged for a 5 [place] MPA catheter. A small incision was made on the right anterior chest wall and a subcutaneous pocket fashioned. The power-injectable port was positioned and its catheter tunneled to the right IJ dermatotomy site. The MPA catheter was exchanged over an Amplatz wire for a peel-away sheath, through which the port catheter, which had been trimmed to the appropriate length, was advanced and positioned under fluoroscopy with its tip at the cavoatrial junction. Spot chest radiograph  confirms good catheter position and no pneumothorax. The pocket was closed with deep interrupted and subcuticular continuous 3-0 Monocryl sutures. The port was  flushed per protocol. The incisions were covered with Dermabond then covered with a sterile dressing. No immediate complication.  Fluoroscopy time: 6 seconds  IMPRESSION Technically successful right IJ power-injectable port catheter placement. Ready for routine use.   Original Report Authenticated By: D. Andria Rhein, MD   Ir US Guide Vasc Access Right  05/08/2013   *RADIOLOGY REPORT*  Clinical Data:Endometrial carcinoma, needs access for chemotherapy.  TUNNELED PORT CATHETER PLACEMENT WITH ULTRASOUND AND FLUOROSCOPIC GUIDANCE  Technique: The procedure, risks, benefits, and alternatives were explained to the patient.  Questions regarding the procedure were encouraged and answered.  The  patient understands and consents to the procedure.  As antibiotic prophylaxis, cefazolin 2 grams was ordered pre- procedure and administered intravenously within one hour of incision.  Patency of the right IJ vein was confirmed with ultrasound with image documentation. An  appropriate skin site was determined. Skin site was marked. Region was prepped using maximum barrier technique including cap and mask, sterile gown, sterile gloves, large sterile sheet, and Chlorhexidine   as cutaneous antisepsis.   The region was infiltrated locally with 1% lidocaine.  Intravenous Fentanyl and Versed were administered as conscious sedation during continuous cardiorespiratory monitoring by the radiology RN, with a total moderate sedation time of 20 minutes.  Under real-time ultrasound guidance, the right IJ vein was accessed with a 21 gauge micropuncture needle; the needle tip within the vein was confirmed with ultrasound image documentation.   Needle was exchanged over a 018 guidewire for transitional dilator which allowed passage of the [facility] wire into the IVC. Over this, the transitional dilator was exchanged for a 5 [place] MPA catheter. A small incision was made on the right anterior chest wall and a subcutaneous pocket fashioned. The power-injectable port was positioned and its catheter tunneled to the right IJ dermatotomy site. The MPA catheter was exchanged over an Amplatz wire for a peel-away sheath, through which the port catheter, which had been trimmed to the appropriate length, was advanced and positioned under fluoroscopy with its tip at the cavoatrial junction. Spot chest radiograph  confirms good catheter position and no pneumothorax. The pocket was closed with deep interrupted and subcuticular continuous 3-0 Monocryl sutures. The port was  flushed per protocol. The incisions were covered with Dermabond then covered with a sterile dressing. No immediate complication.  Fluoroscopy time: 6 seconds  IMPRESSION  Technically successful right IJ power-injectable port catheter placement. Ready for routine use.   Original Report Authenticated By: D. Andria Rhein, MD    ASSESSMENT: 77 year old female with  #1 grade 1 endometrial carcinoma diagnosed June 2014. Patient is status post supracervical hysterectomy that required morcellation to remove the uterus. Size of tumor was 6 cm.  #2 patient is at high risk for development of recurrent disease and therefore she is recommended adjuvant chemotherapy consisting of Taxol and carboplatinum.   PLAN:   #1 patient will proceed with cycle 1 of chemotherapy.  #2 she will return in one week's time for followup and blood work.  #3 she knows to call with any problems   All questions were answered. The patient knows to call the clinic with any problems, questions or concerns. We can certainly see the patient much sooner if necessary.  I spent 25 minutes counseling the patient face to face. The total time spent in the appointment was 30 minutes.    Drue Second, MD Medical/Oncology [facility] [phone] (beeper) [phone] (Office)  05/15/2013, 11:12 AM

## 2013-05-15 NOTE — Patient Instructions (Addendum)
#  1 Proceed with scheduled chemotherapy today.  #2 return in one week's time for follow with my nurse practitioner.

## 2013-05-16 ENCOUNTER — Telehealth: Payer: Self-pay | Admitting: *Deleted

## 2013-05-16 ENCOUNTER — Other Ambulatory Visit: Payer: Self-pay | Admitting: Certified Registered Nurse Anesthetist

## 2013-05-16 NOTE — Telephone Encounter (Signed)
Per staff message and POF I have scheduled appts.  JMW  

## 2013-05-17 ENCOUNTER — Telehealth: Payer: Self-pay | Admitting: *Deleted

## 2013-05-17 NOTE — Telephone Encounter (Signed)
Called reporting feet hurt on the outer edge, internally.  This started after her first taxol/carboplatin treatment.  Describes as a Programmer, systems sensation.  Has not taken anything for this yet but is going to take a tramadol "to stop my legs from hurting".  Asked what she can do to ease this off.  Reviewed how to treat hand- foot syndrome and learned she soaked her feet in warm to hot water after treatment.  Will notify providers.  If any new orders may reach patient at [phone].  Uses [employer].

## 2013-05-23 ENCOUNTER — Encounter: Payer: Self-pay | Admitting: Adult Health

## 2013-05-23 ENCOUNTER — Ambulatory Visit (HOSPITAL_BASED_OUTPATIENT_CLINIC_OR_DEPARTMENT_OTHER): Payer: Medicare Other | Admitting: Adult Health

## 2013-05-23 ENCOUNTER — Other Ambulatory Visit (HOSPITAL_BASED_OUTPATIENT_CLINIC_OR_DEPARTMENT_OTHER): Payer: Medicare Other | Admitting: Lab

## 2013-05-23 VITALS — BP 116/65 | HR 73 | Temp 98.3°F | Resp 20 | Ht 67.0 in | Wt 251.1 lb

## 2013-05-23 DIAGNOSIS — C549 Malignant neoplasm of corpus uteri, unspecified: Secondary | ICD-10-CM

## 2013-05-23 DIAGNOSIS — D702 Other drug-induced agranulocytosis: Secondary | ICD-10-CM

## 2013-05-23 DIAGNOSIS — C541 Malignant neoplasm of endometrium: Secondary | ICD-10-CM

## 2013-05-23 DIAGNOSIS — Z5189 Encounter for other specified aftercare: Secondary | ICD-10-CM

## 2013-05-23 DIAGNOSIS — I10 Essential (primary) hypertension: Secondary | ICD-10-CM

## 2013-05-23 DIAGNOSIS — E119 Type 2 diabetes mellitus without complications: Secondary | ICD-10-CM

## 2013-05-23 LAB — COMPREHENSIVE METABOLIC PANEL (CC13)
AST: 17 U/L (ref 5–34)
Alkaline Phosphatase: 63 U/L (ref 40–150)
BUN: 13.7 mg/dL (ref 7.0–26.0)
Creatinine: 0.8 mg/dL (ref 0.6–1.1)
Glucose: 117 mg/dl (ref 70–140)
Total Bilirubin: 0.73 mg/dL (ref 0.20–1.20)

## 2013-05-23 LAB — CBC WITH DIFFERENTIAL/PLATELET
Basophils Absolute: 0 10*3/uL (ref 0.0–0.1)
EOS%: 3.4 % (ref 0.0–7.0)
Eosinophils Absolute: 0.1 10*3/uL (ref 0.0–0.5)
HGB: 10.6 g/dL — ABNORMAL LOW (ref 11.6–15.9)
LYMPH%: 85.8 % — ABNORMAL HIGH (ref 14.0–49.7)
MCH: 27.2 pg (ref 25.1–34.0)
MCV: 82.3 fL (ref 79.5–101.0)
MONO%: 3.5 % (ref 0.0–14.0)
NEUT#: 0.1 10*3/uL — CL (ref 1.5–6.5)
Platelets: 121 10*3/uL — ABNORMAL LOW (ref 145–400)

## 2013-05-23 MED ORDER — CIPROFLOXACIN HCL 500 MG PO TABS: 500.0000 mg | ORAL_TABLET | Freq: Two times a day (BID) | ORAL | Status: DC

## 2013-05-23 MED ORDER — PEGFILGRASTIM INJECTION 6 MG/0.6ML
6.0000 mg | Freq: Once | SUBCUTANEOUS | Status: AC
  Administered 2013-05-23: 6 mg via SUBCUTANEOUS
  Filled 2013-05-23: qty 0.6

## 2013-05-23 NOTE — Patient Instructions (Addendum)
Take claritin daily for 5 days.  You can take tylenol for bone pain. Please call us if you have any questions or concerns.     Patient Neutropenia Instruction Sheet  Diagnosis: Endometrial Cancer      Treating Physician: Drue Second, MD  Treatment: 1. Type of chemotherapy: Taxol/Carbo 2. Date of last treatment: 05/15/13  Last Blood Counts: Lab Results  Component Value Date   WBC 2.0* 05/23/2013   HGB 10.6* 05/23/2013   HCT 31.9* 05/23/2013   MCV 82.3 05/23/2013   PLT 121* 05/23/2013        Prophylactic Antibiotics: Cipro 500 mg by mouth twice a day Instructions: 1. Monitor temperature and call if fever  greater than 100.5, chills, shaking chills (rigors) 2. Call Physician on-call at [phone] 3. Give him/her symptoms and list of medications that you are taking and your last blood count.      Pegfilgrastim injection What is this medicine? PEGFILGRASTIM (peg fil GRA stim) helps the body make more white blood cells. It is used to prevent infection in people with low amounts of white blood cells following cancer treatment. This medicine may be used for other purposes; ask your health care provider or pharmacist if you have questions. What should I tell my health care provider before I take this medicine? They need to know if you have any of these conditions: -sickle cell disease -an unusual or allergic reaction to pegfilgrastim, filgrastim, E.coli protein, other medicines, foods, dyes, or preservatives -pregnant or trying to get pregnant -breast-feeding How should I use this medicine? This medicine is for injection under the skin. It is usually given by a health care professional in a hospital or clinic setting. If you get this medicine at home, you will be taught how to prepare and give this medicine. Do not shake this medicine. Use exactly as directed. Take your medicine at regular intervals. Do not take your medicine more often than directed. It is important that you put your  used needles and syringes in a special sharps container. Do not put them in a trash can. If you do not have a sharps container, call your pharmacist or healthcare provider to get one. Talk to your pediatrician regarding the use of this medicine in children. While this drug may be prescribed for children who weigh more than 45 kg for selected conditions, precautions do apply Overdosage: If you think you have taken too much of this medicine contact a poison control center or emergency room at once. NOTE: This medicine is only for you. Do not share this medicine with others. What if I miss a dose? If you miss a dose, take it as soon as you can. If it is almost time for your next dose, take only that dose. Do not take double or extra doses. What may interact with this medicine? -lithium -medicines for growth therapy This list may not describe all possible interactions. Give your health care provider a list of all the medicines, herbs, non-prescription drugs, or dietary supplements you use. Also tell them if you smoke, drink alcohol, or use illegal drugs. Some items may interact with your medicine. What should I watch for while using this medicine? Visit your doctor for regular check ups. You will need important blood work done while you are taking this medicine. What side effects may I notice from receiving this medicine? Side effects that you should report to your doctor or health care professional as soon as possible: -allergic reactions like skin rash, itching or  hives, swelling of the face, lips, or tongue -breathing problems -fever -pain, redness, or swelling where injected -shoulder pain -stomach or side pain Side effects that usually do not require medical attention (report to your doctor or health care professional if they continue or are bothersome): -aches, pains -headache -loss of appetite -nausea, vomiting -unusually tired This list may not describe all possible side effects. Call  your doctor for medical advice about side effects. You may report side effects to FDA at 1-800-FDA-1088. Where should I keep my medicine? Keep out of the reach of children. Store in a refrigerator between 2 and 8 degrees C (36 and 46 degrees F). Do not freeze. Keep in carton to protect from light. Throw away this medicine if it is left out of the refrigerator for more than 48 hours. Throw away any unused medicine after the expiration date. NOTE: This sheet is a summary. It may not cover all possible information. If you have questions about this medicine, talk to your doctor, pharmacist, or health care provider.  2013, [employer]. (04/09/2008 3:41:44 PM)

## 2013-05-23 NOTE — Progress Notes (Signed)
OFFICE PROGRESS NOTE  CC**  No PCP Per Patient [address] [place] [address] Dr. Emmaline Kluver  DIAGNOSIS: 77 year old female with grade 1 endometrial cancer diagnosed June 2014   PRIOR THERAPY:  #1 She had been having abnormal bleeding for a number of months. She underwent an evaluation which included endometrial biopsy and D&C. The patient and on 03/15/2013 underwent microscopic supra-cervical hysterectomy which required morcellation to remove the uterus. The final pathology revealed a 6 cm aggregate tumor size. Of invasion could not be determined the tubes and ovaries appeared not to be involved with cancer  #2 She was seen by Dr. Loree Fee who has recommended adjuvant chemotherapy consisting of Taxol and carboplatinum. Patient is here to begin cycle 1 day 1 of Taxol carboplatinum. Total of 3 cycles is planned initially. This will then be followed by radiation therapy and then another 3 cycles of chemotherapy.   CURRENT THERAPY: Cycle 1 day 8 of Taxol carboplatinum adjuvantly  INTERVAL HISTORY: MADINE SARR 77 y.o. female returns for followup visit. She is doing moderately well today.  She is tearful because she is overwhelmed with the medications for after chemotherapy.  She has longstanding diabetic neuropathy and she take Gabapentin QHS for this.  She feels like it is worse in her feet bilaterally, and in the first three digits on her fingertips bilaterally.  Otherwise, she denies fevers, chills, nausea, vomiting, constipation, diarrhea, or further concerns.   MEDICAL HISTORY: Past Medical History  Diagnosis Date  . Hypertension   . Diabetes mellitus without complication   . Kidney tumor   . Arthritis   . A-fib   . Cancer 03/2013    Endometrial cancer  . Hx of migraines   . CHF (congestive heart failure)   . Neuropathy     ALLERGIES:  is allergic to statins and sulfur.  MEDICATIONS:  Current Outpatient Prescriptions  Medication Sig Dispense  Refill  . Cholecalciferol (VITAMIN D-3) 1000 UNITS CAPS Take 1 capsule by mouth daily.       Marland Kitchen dexamethasone (DECADRON) 4 MG tablet Take 2 tablets (8 mg total) by mouth 2 (two) times daily with a meal. Take two times a day starting the day after chemotherapy for 3 days.  30 tablet  1  . digoxin (LANOXIN) 0.125 MG tablet Take 0.125 mg by mouth every Monday, Wednesday, and Friday.       . ferrous sulfate 325 (65 FE) MG tablet Take 325 mg by mouth daily with breakfast.      . fish oil-omega-3 fatty acids 1000 MG capsule Take 1 g by mouth 2 (two) times daily.       . folic acid (FOLVITE) 1 MG tablet Take 1 mg by mouth daily.      . furosemide (LASIX) 80 MG tablet Take 120 mg by mouth daily.       Marland Kitchen gabapentin (NEURONTIN) 300 MG capsule Take 300 mg by mouth every evening.       . insulin glargine (LANTUS) 100 UNIT/ML injection Inject 40-51 Units into the skin 2 (two) times daily. 51u in am, 40u in pm      . insulin lispro (HUMALOG) 100 UNIT/ML injection Inject 8-9 Units into the skin 2 (two) times daily. 8u in am, 9u in pm      . lidocaine-prilocaine (EMLA) cream Apply topically as needed.  30 g  6  . LORazepam (ATIVAN) 0.5 MG tablet Take 1 tablet (0.5 mg total) by mouth every 6 (six) hours  as needed (Nausea or vomiting).  30 tablet  0  . ondansetron (ZOFRAN) 8 MG tablet Take 1 tablet (8 mg total) by mouth 2 (two) times daily. Take two times a day starting the day after chemo for 3 days. Then take two times a day as needed for nausea or vomiting.  30 tablet  1  . potassium chloride (K-DUR) 10 MEQ tablet Take 10 mEq by mouth 2 (two) times daily.      . prochlorperazine (COMPAZINE) 10 MG tablet Take 1 tablet (10 mg total) by mouth every 6 (six) hours as needed (Nausea or vomiting).  30 tablet  1  . rosuvastatin (CRESTOR) 10 MG tablet Take 10 mg by mouth daily.      . traMADol (ULTRAM) 50 MG tablet Take 50 mg by mouth 2 (two) times daily as needed for pain.       Marland Kitchen UNABLE TO FIND Apply 1 application  topically once. Med Name: cranial prosthesis  1 application  0  . verapamil (CALAN-SR) 180 MG CR tablet Take 180 mg by mouth 2 (two) times daily.      . vitamin B-12 (CYANOCOBALAMIN) 1000 MCG tablet Take 1,000 mcg by mouth daily.      . vitamin C (ASCORBIC ACID) 500 MG tablet Take 500 mg by mouth daily.      Marland Kitchen warfarin (COUMADIN) 7.5 MG tablet Take 3.75-7.5 mg by mouth daily. 1 tab daily except 0.5 tab on Tues and Thurs       No current facility-administered medications for this visit.   Facility-Administered Medications Ordered in Other Visits  Medication Dose Route Frequency Provider Last Rate Last Dose  . sodium chloride 0.9 % injection 10 mL  10 mL Intracatheter PRN Victorino December, MD        SURGICAL HISTORY:  Past Surgical History  Procedure Laterality Date  . Tumor removal  2012    Kidney  . Replacement total knee bilateral      10 yrs ago, 29 yrs ago  . Ankle surgery Left   . Abdominal hysterectomy  03/15/2013  . Dilation and curettage of uterus  12/2012  . Hernia repair      x2    REVIEW OF SYSTEMS:  Pertinent items are noted in HPI.    PHYSICAL EXAMINATION: Blood pressure 116/65, pulse 73, temperature 98.3 F (36.8 C), temperature source Oral, resp. rate 20, height 5\' 7"  (1.702 m), weight 251 lb 1.6 oz (113.898 kg). Body mass index is 39.32 kg/(m^2). General: Patient is a well appearing female in no acute distress HEENT: PERRLA, sclerae anicteric no conjunctival pallor, MMM Neck: supple, no palpable adenopathy Lungs: clear to auscultation bilaterally, no wheezes, rhonchi, or rales Cardiovascular: regular rate rhythm, S1, S2, no murmurs, rubs or gallops Abdomen: Soft, non-tender, non-distended, normoactive bowel sounds, no HSM Extremities: warm and well perfused, no clubbing, cyanosis, or edema Skin: No rashes or lesions Neuro: Non-focal ECOG PERFORMANCE STATUS: 1 - Symptomatic but completely ambulatory    LABORATORY DATA: Lab Results  Component Value Date   WBC  2.0* 05/23/2013   HGB 10.6* 05/23/2013   HCT 31.9* 05/23/2013   MCV 82.3 05/23/2013   PLT 121* 05/23/2013      Chemistry      Component Value Date/Time   NA 145 05/15/2013 0956   K 3.2* 05/15/2013 0956   CO2 29 05/15/2013 0956   BUN 13.1 05/15/2013 0956   CREATININE 0.9 05/15/2013 0956      Component Value Date/Time   CALCIUM  9.1 05/15/2013 0956   ALKPHOS 72 05/15/2013 0956   AST 14 05/15/2013 0956   ALT 12 05/15/2013 0956   BILITOT 0.53 05/15/2013 0956       RADIOGRAPHIC STUDIES:  Nm Pet Image Initial (pi) Skull Base To Thigh  04/21/2013   *RADIOLOGY REPORT*  Clinical Data: Initial treatment strategy for endometrial carcinoma.  Endometrial carcinoma  NUCLEAR MEDICINE PET SKULL BASE TO THIGH  Fasting Blood Glucose:  162  Technique:  18.3 mCi F-18 FDG was injected intravenously.   CT data was obtained and used for attenuation correction and anatomic localization only.  (This was not acquired as a diagnostic CT examination.) Additional exam technical data entered  on technologist worksheet.  Comparison: None  Findings:  Neck: No hypermetabolic nodes in the neck.  Chest: No hypermetabolic mediastinal or hilar nodes.  No suspicious pulmonary nodules.  Abdomen / Pelvis:Post hysterectomy. Small common iliac and nodes on the right measure less than 10 mm (image 185).  No hypermetabolic pelvic lymph nodes.   No abnormal hypermetabolic activity within the solid organs.  No evidence of abdominal or pelvic hypermetabolic nodes.  Skeleton:No focal hypermetabolic activity to suggest skeletal metastasis.  IMPRESSION: No evidence of local endometrial cancer metastasis or distant metastasis.   Original Report Authenticated By: Genevive Bi, M.D.   Ir Fluoro Guide Cv Line Right  05/08/2013   *RADIOLOGY REPORT*  Clinical Data:Endometrial carcinoma, needs access for chemotherapy.  TUNNELED PORT CATHETER PLACEMENT WITH ULTRASOUND AND FLUOROSCOPIC GUIDANCE  Technique: The procedure, risks, benefits, and alternatives were  explained to the patient.  Questions regarding the procedure were encouraged and answered.  The patient understands and consents to the procedure.  As antibiotic prophylaxis, cefazolin 2 grams was ordered pre- procedure and administered intravenously within one hour of incision.  Patency of the right IJ vein was confirmed with ultrasound with image documentation. An appropriate skin site was determined. Skin site was marked. Region was prepped using maximum barrier technique including cap and mask, sterile gown, sterile gloves, large sterile sheet, and Chlorhexidine   as cutaneous antisepsis.   The region was infiltrated locally with 1% lidocaine.  Intravenous Fentanyl and Versed were administered as conscious sedation during continuous cardiorespiratory monitoring by the radiology RN, with a total moderate sedation time of 20 minutes.  Under real-time ultrasound guidance, the right IJ vein was accessed with a 21 gauge micropuncture needle; the needle tip within the vein was confirmed with ultrasound image documentation.   Needle was exchanged over a 018 guidewire for transitional dilator which allowed passage of the [facility] wire into the IVC. Over this, the transitional dilator was exchanged for a 5 [place] MPA catheter. A small incision was made on the right anterior chest wall and a subcutaneous pocket fashioned. The power-injectable port was positioned and its catheter tunneled to the right IJ dermatotomy site. The MPA catheter was exchanged over an Amplatz wire for a peel-away sheath, through which the port catheter, which had been trimmed to the appropriate length, was advanced and positioned under fluoroscopy with its tip at the cavoatrial junction. Spot chest radiograph  confirms good catheter position and no pneumothorax. The pocket was closed with deep interrupted and subcuticular continuous 3-0 Monocryl sutures. The port was  flushed per protocol. The incisions were covered with Dermabond then covered with  a sterile dressing. No immediate complication.  Fluoroscopy time: 6 seconds  IMPRESSION Technically successful right IJ power-injectable port catheter placement. Ready for routine use.   Original Report Authenticated By: D. Hassell III,  MD   Ir US Guide Vasc Access Right  05/08/2013   *RADIOLOGY REPORT*  Clinical Data:Endometrial carcinoma, needs access for chemotherapy.  TUNNELED PORT CATHETER PLACEMENT WITH ULTRASOUND AND FLUOROSCOPIC GUIDANCE  Technique: The procedure, risks, benefits, and alternatives were explained to the patient.  Questions regarding the procedure were encouraged and answered.  The patient understands and consents to the procedure.  As antibiotic prophylaxis, cefazolin 2 grams was ordered pre- procedure and administered intravenously within one hour of incision.  Patency of the right IJ vein was confirmed with ultrasound with image documentation. An appropriate skin site was determined. Skin site was marked. Region was prepped using maximum barrier technique including cap and mask, sterile gown, sterile gloves, large sterile sheet, and Chlorhexidine   as cutaneous antisepsis.   The region was infiltrated locally with 1% lidocaine.  Intravenous Fentanyl and Versed were administered as conscious sedation during continuous cardiorespiratory monitoring by the radiology RN, with a total moderate sedation time of 20 minutes.  Under real-time ultrasound guidance, the right IJ vein was accessed with a 21 gauge micropuncture needle; the needle tip within the vein was confirmed with ultrasound image documentation.   Needle was exchanged over a 018 guidewire for transitional dilator which allowed passage of the [employer] wire into the IVC. Over this, the transitional dilator was exchanged for a 5 [place] MPA catheter. A small incision was made on the right anterior chest wall and a subcutaneous pocket fashioned. The power-injectable port was positioned and its catheter tunneled to the right IJ dermatotomy  site. The MPA catheter was exchanged over an Amplatz wire for a peel-away sheath, through which the port catheter, which had been trimmed to the appropriate length, was advanced and positioned under fluoroscopy with its tip at the cavoatrial junction. Spot chest radiograph  confirms good catheter position and no pneumothorax. The pocket was closed with deep interrupted and subcuticular continuous 3-0 Monocryl sutures. The port was  flushed per protocol. The incisions were covered with Dermabond then covered with a sterile dressing. No immediate complication.  Fluoroscopy time: 6 seconds  IMPRESSION Technically successful right IJ power-injectable port catheter placement. Ready for routine use.   Original Report Authenticated By: D. Andria Rhein, MD    ASSESSMENT: 77 year old female with  #1 grade 1 endometrial carcinoma diagnosed June 2014. Patient is status post supracervical hysterectomy that required morcellation to remove the uterus. Size of tumor was 6 cm.  #2 patient is at high risk for development of recurrent disease and therefore she is recommended adjuvant chemotherapy consisting of Taxol and carboplatinum.   PLAN:   #1 She is neutropenic today following chemotherapy.  I reviewed her anti-emetics with her again.  I reviewed neutropenic instructions with her in detail and gave her detailed instructions in her AVS.  She will start Cipro BID.  She will also receive Neulasta today.    #2 Gave patient reassurance.  She will return in 2 weeks for her next  Chemotherapy and she will receive Neulasta support.    All questions were answered. The patient knows to call the clinic with any problems, questions or concerns. We can certainly see the patient much sooner if necessary.  I spent 25 minutes counseling the patient face to face. The total time spent in the appointment was 30 minutes.   Cherie Ouch Lyn Hollingshead, NP Medical Oncology [facility] Phone: [phone] 05/23/2013,  11:13 AM

## 2013-05-28 ENCOUNTER — Telehealth: Payer: Self-pay | Admitting: Oncology

## 2013-05-28 NOTE — Telephone Encounter (Signed)
Added inj 9/16 per 9/2 pof. Pt has lb/fu/tx 9/15 and will be given new schedule then.

## 2013-05-29 ENCOUNTER — Encounter: Payer: Self-pay | Admitting: Oncology

## 2013-06-05 ENCOUNTER — Encounter: Payer: Self-pay | Admitting: Oncology

## 2013-06-05 ENCOUNTER — Other Ambulatory Visit (HOSPITAL_BASED_OUTPATIENT_CLINIC_OR_DEPARTMENT_OTHER): Payer: Medicare Other | Admitting: Lab

## 2013-06-05 ENCOUNTER — Ambulatory Visit (HOSPITAL_BASED_OUTPATIENT_CLINIC_OR_DEPARTMENT_OTHER): Payer: Medicare Other

## 2013-06-05 ENCOUNTER — Ambulatory Visit (HOSPITAL_BASED_OUTPATIENT_CLINIC_OR_DEPARTMENT_OTHER): Payer: Medicare Other | Admitting: Oncology

## 2013-06-05 VITALS — BP 156/87 | HR 78 | Temp 97.0°F | Resp 18

## 2013-06-05 VITALS — BP 125/72 | HR 101 | Temp 98.1°F | Resp 20 | Ht 67.0 in | Wt 256.6 lb

## 2013-06-05 DIAGNOSIS — C549 Malignant neoplasm of corpus uteri, unspecified: Secondary | ICD-10-CM

## 2013-06-05 DIAGNOSIS — Z5111 Encounter for antineoplastic chemotherapy: Secondary | ICD-10-CM

## 2013-06-05 DIAGNOSIS — C801 Malignant (primary) neoplasm, unspecified: Secondary | ICD-10-CM

## 2013-06-05 DIAGNOSIS — C541 Malignant neoplasm of endometrium: Secondary | ICD-10-CM

## 2013-06-05 LAB — COMPREHENSIVE METABOLIC PANEL (CC13)
Albumin: 2.8 g/dL — ABNORMAL LOW (ref 3.5–5.0)
Alkaline Phosphatase: 93 U/L (ref 40–150)
BUN: 12 mg/dL (ref 7.0–26.0)
Creatinine: 0.8 mg/dL (ref 0.6–1.1)
Glucose: 229 mg/dl — ABNORMAL HIGH (ref 70–140)
Potassium: 3.1 mEq/L — ABNORMAL LOW (ref 3.5–5.1)

## 2013-06-05 LAB — CBC WITH DIFFERENTIAL/PLATELET
Basophils Absolute: 0 10*3/uL (ref 0.0–0.1)
Eosinophils Absolute: 0 10*3/uL (ref 0.0–0.5)
HCT: 33.9 % — ABNORMAL LOW (ref 34.8–46.6)
HGB: 10.7 g/dL — ABNORMAL LOW (ref 11.6–15.9)
LYMPH%: 32.7 % (ref 14.0–49.7)
MCH: 26.8 pg (ref 25.1–34.0)
MCV: 84.8 fL (ref 79.5–101.0)
MONO%: 7.4 % (ref 0.0–14.0)
NEUT#: 4.5 10*3/uL (ref 1.5–6.5)
NEUT%: 59.4 % (ref 38.4–76.8)
Platelets: 290 10*3/uL (ref 145–400)
RDW: 17.9 % — ABNORMAL HIGH (ref 11.2–14.5)

## 2013-06-05 MED ORDER — ONDANSETRON 16 MG/50ML IVPB (CHCC)
INTRAVENOUS | Status: AC
  Filled 2013-06-05: qty 16

## 2013-06-05 MED ORDER — SODIUM CHLORIDE 0.9 % IV SOLN
Freq: Once | INTRAVENOUS | Status: AC
  Administered 2013-06-05: 13:00:00 via INTRAVENOUS

## 2013-06-05 MED ORDER — DEXAMETHASONE SODIUM PHOSPHATE 20 MG/5ML IJ SOLN
20.0000 mg | Freq: Once | INTRAMUSCULAR | Status: AC
  Administered 2013-06-05: 20 mg via INTRAVENOUS

## 2013-06-05 MED ORDER — HEPARIN SOD (PORK) LOCK FLUSH 100 UNIT/ML IV SOLN
500.0000 [IU] | Freq: Once | INTRAVENOUS | Status: AC | PRN
  Administered 2013-06-05: 500 [IU]
  Filled 2013-06-05: qty 5

## 2013-06-05 MED ORDER — DIPHENHYDRAMINE HCL 50 MG/ML IJ SOLN
50.0000 mg | Freq: Once | INTRAMUSCULAR | Status: AC
  Administered 2013-06-05: 50 mg via INTRAVENOUS

## 2013-06-05 MED ORDER — SODIUM CHLORIDE 0.9 % IJ SOLN
10.0000 mL | INTRAMUSCULAR | Status: DC | PRN
  Administered 2013-06-05: 10 mL
  Filled 2013-06-05: qty 10

## 2013-06-05 MED ORDER — ONDANSETRON 16 MG/50ML IVPB (CHCC)
16.0000 mg | Freq: Once | INTRAVENOUS | Status: AC
  Administered 2013-06-05: 16 mg via INTRAVENOUS

## 2013-06-05 MED ORDER — DIPHENHYDRAMINE HCL 50 MG/ML IJ SOLN
INTRAMUSCULAR | Status: AC
  Filled 2013-06-05: qty 1

## 2013-06-05 MED ORDER — DEXTROSE 5 % IV SOLN
175.0000 mg/m2 | Freq: Once | INTRAVENOUS | Status: AC
  Administered 2013-06-05: 414 mg via INTRAVENOUS
  Filled 2013-06-05: qty 69

## 2013-06-05 MED ORDER — SODIUM CHLORIDE 0.9 % IV SOLN
625.0000 mg | Freq: Once | INTRAVENOUS | Status: AC
  Administered 2013-06-05: 630 mg via INTRAVENOUS
  Filled 2013-06-05: qty 63

## 2013-06-05 MED ORDER — FAMOTIDINE IN NACL 20-0.9 MG/50ML-% IV SOLN
20.0000 mg | Freq: Once | INTRAVENOUS | Status: AC
  Administered 2013-06-05: 20 mg via INTRAVENOUS

## 2013-06-05 MED ORDER — FAMOTIDINE IN NACL 20-0.9 MG/50ML-% IV SOLN
INTRAVENOUS | Status: AC
Start: 2013-06-05 — End: 2013-06-05
  Filled 2013-06-05: qty 50

## 2013-06-05 MED ORDER — DEXAMETHASONE SODIUM PHOSPHATE 20 MG/5ML IJ SOLN
INTRAMUSCULAR | Status: AC
  Filled 2013-06-05: qty 5

## 2013-06-05 NOTE — Patient Instructions (Signed)
[facility] Discharge Instructions for Patients Receiving Chemotherapy  Today you received the following chemotherapy agents Taxol and Carboplatin.  To help prevent nausea and vomiting after your treatment, we encourage you to take your nausea medication Zofran every 12 hours OR Compazine every 6 hours if needed for nausea.  If you develop nausea and vomiting that is not controlled by your nausea medication, call the clinic.   BELOW ARE SYMPTOMS THAT SHOULD BE REPORTED IMMEDIATELY:  *FEVER GREATER THAN 100.5 F  *CHILLS WITH OR WITHOUT FEVER  NAUSEA AND VOMITING THAT IS NOT CONTROLLED WITH YOUR NAUSEA MEDICATION  *UNUSUAL SHORTNESS OF BREATH  *UNUSUAL BRUISING OR BLEEDING  TENDERNESS IN MOUTH AND THROAT WITH OR WITHOUT PRESENCE OF ULCERS  *URINARY PROBLEMS  *BOWEL PROBLEMS  UNUSUAL RASH Items with * indicate a potential emergency and should be followed up as soon as possible.  Feel free to call the clinic you have any questions or concerns. The clinic phone number is [phone].

## 2013-06-05 NOTE — Progress Notes (Signed)
OFFICE PROGRESS NOTE  CC**  No PCP Per Patient [address] [place] [address] Dr. Emmaline Kluver  DIAGNOSIS: 77 year old female with grade 1 endometrial cancer diagnosed June 2014   PRIOR THERAPY:  #1 She had been having abnormal bleeding for a number of months. She underwent an evaluation which included endometrial biopsy and D&C. The patient and on 03/15/2013 underwent microscopic supra-cervical hysterectomy which required morcellation to remove the uterus. The final pathology revealed a 6 cm aggregate tumor size. Of invasion could not be determined the tubes and ovaries appeared not to be involved with cancer  #2 She was seen by Dr. Loree Fee who has recommended adjuvant chemotherapy consisting of Taxol and carboplatinum. Patient is here to begin cycle 1 day 1 of Taxol carboplatinum. Total of 3 cycles is planned initially. This will then be followed by radiation therapy and then another 3 cycles of chemotherapy.   CURRENT THERAPY: Cycle 2 day 1 of Taxol carboplatinum adjuvantly  INTERVAL HISTORY: Monica Compton 77 y.o. female returns for followup visit. She is doing moderately well today.  She is tearful because she is overwhelmed with the medications for after chemotherapy.  She has longstanding diabetic neuropathy and she take Gabapentin QHS for this.  She feels like it is worse in her feet bilaterally, and in the first three digits on her fingertips bilaterally.  Otherwise, she denies fevers, chills, nausea, vomiting, constipation, diarrhea, or further concerns.   MEDICAL HISTORY: Past Medical History  Diagnosis Date  . Hypertension   . Diabetes mellitus without complication   . Kidney tumor   . Arthritis   . A-fib   . Cancer 03/2013    Endometrial cancer  . Hx of migraines   . CHF (congestive heart failure)   . Neuropathy     ALLERGIES:  is allergic to statins and sulfur.  MEDICATIONS:  Current Outpatient Prescriptions  Medication Sig Dispense  Refill  . Cholecalciferol (VITAMIN D-3) 1000 UNITS CAPS Take 1 capsule by mouth daily.       . digoxin (LANOXIN) 0.125 MG tablet Take 0.125 mg by mouth every Monday, Wednesday, and Friday.       . ferrous sulfate 325 (65 FE) MG tablet Take 325 mg by mouth daily with breakfast.      . fish oil-omega-3 fatty acids 1000 MG capsule Take 1 g by mouth 2 (two) times daily.       . folic acid (FOLVITE) 1 MG tablet Take 1 mg by mouth daily.      . furosemide (LASIX) 80 MG tablet Take 120 mg by mouth daily.       Marland Kitchen gabapentin (NEURONTIN) 300 MG capsule Take 300 mg by mouth every evening.       . insulin glargine (LANTUS) 100 UNIT/ML injection Inject 40-51 Units into the skin 2 (two) times daily. 51u in am, 40u in pm      . insulin lispro (HUMALOG) 100 UNIT/ML injection Inject 8-9 Units into the skin 2 (two) times daily. 8u in am, 9u in pm      . lidocaine-prilocaine (EMLA) cream Apply topically as needed.  30 g  6  . potassium chloride (K-DUR) 10 MEQ tablet Take 10 mEq by mouth 2 (two) times daily.      . rosuvastatin (CRESTOR) 10 MG tablet Take 10 mg by mouth daily.      . traMADol (ULTRAM) 50 MG tablet Take 50 mg by mouth 2 (two) times daily as needed for pain.       Marland Kitchen  verapamil (CALAN-SR) 180 MG CR tablet Take 180 mg by mouth 2 (two) times daily.      . vitamin B-12 (CYANOCOBALAMIN) 1000 MCG tablet Take 1,000 mcg by mouth daily.      . vitamin C (ASCORBIC ACID) 500 MG tablet Take 500 mg by mouth daily.      Marland Kitchen warfarin (COUMADIN) 7.5 MG tablet Take 3.75-7.5 mg by mouth daily. 1 tab daily except 0.5 tab on Tues and Thurs      . ciprofloxacin (CIPRO) 500 MG tablet Take 1 tablet (500 mg total) by mouth 2 (two) times daily.  14 tablet  0  . dexamethasone (DECADRON) 4 MG tablet Take 2 tablets (8 mg total) by mouth 2 (two) times daily with a meal. Take two times a day starting the day after chemotherapy for 3 days.  30 tablet  1  . LORazepam (ATIVAN) 0.5 MG tablet Take 1 tablet (0.5 mg total) by mouth every 6  (six) hours as needed (Nausea or vomiting).  30 tablet  0  . ondansetron (ZOFRAN) 8 MG tablet Take 1 tablet (8 mg total) by mouth 2 (two) times daily. Take two times a day starting the day after chemo for 3 days. Then take two times a day as needed for nausea or vomiting.  30 tablet  1  . prochlorperazine (COMPAZINE) 10 MG tablet Take 1 tablet (10 mg total) by mouth every 6 (six) hours as needed (Nausea or vomiting).  30 tablet  1  . UNABLE TO FIND Apply 1 application topically once. Med Name: cranial prosthesis  1 application  0   No current facility-administered medications for this visit.   Facility-Administered Medications Ordered in Other Visits  Medication Dose Route Frequency Provider Last Rate Last Dose  . sodium chloride 0.9 % injection 10 mL  10 mL Intracatheter PRN Victorino December, MD        SURGICAL HISTORY:  Past Surgical History  Procedure Laterality Date  . Tumor removal  2012    Kidney  . Replacement total knee bilateral      10 yrs ago, 29 yrs ago  . Ankle surgery Left   . Abdominal hysterectomy  03/15/2013  . Dilation and curettage of uterus  12/2012  . Hernia repair      x2    REVIEW OF SYSTEMS:  Pertinent items are noted in HPI.    PHYSICAL EXAMINATION: Blood pressure 125/72, pulse 101, temperature 98.1 F (36.7 C), temperature source Oral, resp. rate 20, height 5\' 7"  (1.702 m), weight 256 lb 9.6 oz (116.393 kg). Body mass index is 40.18 kg/(m^2). General: Patient is a well appearing female in no acute distress HEENT: PERRLA, sclerae anicteric no conjunctival pallor, MMM Neck: supple, no palpable adenopathy Lungs: clear to auscultation bilaterally, no wheezes, rhonchi, or rales Cardiovascular: regular rate rhythm, S1, S2, no murmurs, rubs or gallops Abdomen: Soft, non-tender, non-distended, normoactive bowel sounds, no HSM Extremities: warm and well perfused, no clubbing, cyanosis, or edema Skin: No rashes or lesions Neuro: Non-focal ECOG PERFORMANCE STATUS:  1 - Symptomatic but completely ambulatory    LABORATORY DATA: Lab Results  Component Value Date   WBC 7.5 06/05/2013   HGB 10.7* 06/05/2013   HCT 33.9* 06/05/2013   MCV 84.8 06/05/2013   PLT 290 06/05/2013      Chemistry      Component Value Date/Time   NA 145 05/23/2013 1044   K 3.5 05/23/2013 1044   CO2 31* 05/23/2013 1044   BUN 13.7 05/23/2013  1044   CREATININE 0.8 05/23/2013 1044      Component Value Date/Time   CALCIUM 9.0 05/23/2013 1044   ALKPHOS 63 05/23/2013 1044   AST 17 05/23/2013 1044   ALT 21 05/23/2013 1044   BILITOT 0.73 05/23/2013 1044       RADIOGRAPHIC STUDIES:  Nm Pet Image Initial (pi) Skull Base To Thigh  04/21/2013   *RADIOLOGY REPORT*  Clinical Data: Initial treatment strategy for endometrial carcinoma.  Endometrial carcinoma  NUCLEAR MEDICINE PET SKULL BASE TO THIGH  Fasting Blood Glucose:  162  Technique:  18.3 mCi F-18 FDG was injected intravenously.   CT data was obtained and used for attenuation correction and anatomic localization only.  (This was not acquired as a diagnostic CT examination.) Additional exam technical data entered  on technologist worksheet.  Comparison: None  Findings:  Neck: No hypermetabolic nodes in the neck.  Chest: No hypermetabolic mediastinal or hilar nodes.  No suspicious pulmonary nodules.  Abdomen / Pelvis:Post hysterectomy. Small common iliac and nodes on the right measure less than 10 mm (image 185).  No hypermetabolic pelvic lymph nodes.   No abnormal hypermetabolic activity within the solid organs.  No evidence of abdominal or pelvic hypermetabolic nodes.  Skeleton:No focal hypermetabolic activity to suggest skeletal metastasis.  IMPRESSION: No evidence of local endometrial cancer metastasis or distant metastasis.   Original Report Authenticated By: Genevive Bi, M.D.   Ir Fluoro Guide Cv Line Right  05/08/2013   *RADIOLOGY REPORT*  Clinical Data:Endometrial carcinoma, needs access for chemotherapy.  TUNNELED PORT CATHETER PLACEMENT WITH  ULTRASOUND AND FLUOROSCOPIC GUIDANCE  Technique: The procedure, risks, benefits, and alternatives were explained to the patient.  Questions regarding the procedure were encouraged and answered.  The patient understands and consents to the procedure.  As antibiotic prophylaxis, cefazolin 2 grams was ordered pre- procedure and administered intravenously within one hour of incision.  Patency of the right IJ vein was confirmed with ultrasound with image documentation. An appropriate skin site was determined. Skin site was marked. Region was prepped using maximum barrier technique including cap and mask, sterile gown, sterile gloves, large sterile sheet, and Chlorhexidine   as cutaneous antisepsis.   The region was infiltrated locally with 1% lidocaine.  Intravenous Fentanyl and Versed were administered as conscious sedation during continuous cardiorespiratory monitoring by the radiology RN, with a total moderate sedation time of 20 minutes.  Under real-time ultrasound guidance, the right IJ vein was accessed with a 21 gauge micropuncture needle; the needle tip within the vein was confirmed with ultrasound image documentation.   Needle was exchanged over a 018 guidewire for transitional dilator which allowed passage of the [facility] wire into the IVC. Over this, the transitional dilator was exchanged for a 5 [place] MPA catheter. A small incision was made on the right anterior chest wall and a subcutaneous pocket fashioned. The power-injectable port was positioned and its catheter tunneled to the right IJ dermatotomy site. The MPA catheter was exchanged over an Amplatz wire for a peel-away sheath, through which the port catheter, which had been trimmed to the appropriate length, was advanced and positioned under fluoroscopy with its tip at the cavoatrial junction. Spot chest radiograph  confirms good catheter position and no pneumothorax. The pocket was closed with deep interrupted and subcuticular continuous 3-0 Monocryl  sutures. The port was  flushed per protocol. The incisions were covered with Dermabond then covered with a sterile dressing. No immediate complication.  Fluoroscopy time: 6 seconds  IMPRESSION Technically successful right  IJ power-injectable port catheter placement. Ready for routine use.   Original Report Authenticated By: D. Andria Rhein, MD   Ir US Guide Vasc Access Right  05/08/2013   *RADIOLOGY REPORT*  Clinical Data:Endometrial carcinoma, needs access for chemotherapy.  TUNNELED PORT CATHETER PLACEMENT WITH ULTRASOUND AND FLUOROSCOPIC GUIDANCE  Technique: The procedure, risks, benefits, and alternatives were explained to the patient.  Questions regarding the procedure were encouraged and answered.  The patient understands and consents to the procedure.  As antibiotic prophylaxis, cefazolin 2 grams was ordered pre- procedure and administered intravenously within one hour of incision.  Patency of the right IJ vein was confirmed with ultrasound with image documentation. An appropriate skin site was determined. Skin site was marked. Region was prepped using maximum barrier technique including cap and mask, sterile gown, sterile gloves, large sterile sheet, and Chlorhexidine   as cutaneous antisepsis.   The region was infiltrated locally with 1% lidocaine.  Intravenous Fentanyl and Versed were administered as conscious sedation during continuous cardiorespiratory monitoring by the radiology RN, with a total moderate sedation time of 20 minutes.  Under real-time ultrasound guidance, the right IJ vein was accessed with a 21 gauge micropuncture needle; the needle tip within the vein was confirmed with ultrasound image documentation.   Needle was exchanged over a 018 guidewire for transitional dilator which allowed passage of the [facility] wire into the IVC. Over this, the transitional dilator was exchanged for a 5 [place] MPA catheter. A small incision was made on the right anterior chest wall and a subcutaneous pocket  fashioned. The power-injectable port was positioned and its catheter tunneled to the right IJ dermatotomy site. The MPA catheter was exchanged over an Amplatz wire for a peel-away sheath, through which the port catheter, which had been trimmed to the appropriate length, was advanced and positioned under fluoroscopy with its tip at the cavoatrial junction. Spot chest radiograph  confirms good catheter position and no pneumothorax. The pocket was closed with deep interrupted and subcuticular continuous 3-0 Monocryl sutures. The port was  flushed per protocol. The incisions were covered with Dermabond then covered with a sterile dressing. No immediate complication.  Fluoroscopy time: 6 seconds  IMPRESSION Technically successful right IJ power-injectable port catheter placement. Ready for routine use.   Original Report Authenticated By: D. Andria Rhein, MD    ASSESSMENT: 77 year old female with  #1 grade 1 endometrial carcinoma diagnosed June 2014. Patient is status post supracervical hysterectomy that required morcellation to remove the uterus. Size of tumor was 6 cm.  #2 patient is at high risk for development of recurrent disease and therefore she is recommended adjuvant chemotherapy consisting of Taxol and carboplatinum.  #3 evaluation by Home health  #4 refer to physical therapy  #5 Neulasta injections with subsequent chemotherapy   PLAN:    #1 patient will proceed with cycle #2 of paclitaxel and carboplatinum.  #2 she will return in one week's time for followup.  #3 cycle 3 of her chemotherapy is due on 06/26/2013  All questions were answered. The patient knows to call the clinic with any problems, questions or concerns. We can certainly see the patient much sooner if necessary.  I spent 25 minutes counseling the patient face to face. The total time spent in the appointment was 30 minutes.

## 2013-06-06 ENCOUNTER — Ambulatory Visit (HOSPITAL_BASED_OUTPATIENT_CLINIC_OR_DEPARTMENT_OTHER): Payer: Medicare Other

## 2013-06-06 VITALS — BP 127/62 | HR 67 | Temp 97.8°F | Resp 18

## 2013-06-06 DIAGNOSIS — C549 Malignant neoplasm of corpus uteri, unspecified: Secondary | ICD-10-CM

## 2013-06-06 DIAGNOSIS — Z23 Encounter for immunization: Secondary | ICD-10-CM

## 2013-06-06 DIAGNOSIS — C541 Malignant neoplasm of endometrium: Secondary | ICD-10-CM

## 2013-06-06 DIAGNOSIS — Z5189 Encounter for other specified aftercare: Secondary | ICD-10-CM

## 2013-06-06 DIAGNOSIS — C801 Malignant (primary) neoplasm, unspecified: Secondary | ICD-10-CM

## 2013-06-06 MED ORDER — SODIUM CHLORIDE 0.9 % IV SOLN: 25.0000 mg/m2 | Freq: Once | INTRAVENOUS | Status: DC

## 2013-06-06 MED ORDER — INFLUENZA VAC SPLIT QUAD 0.5 ML IM SUSP
0.5000 mL | Freq: Once | INTRAMUSCULAR | Status: AC
  Administered 2013-06-06: 0.5 mL via INTRAMUSCULAR
  Filled 2013-06-06: qty 0.5

## 2013-06-06 MED ORDER — PEGFILGRASTIM INJECTION 6 MG/0.6ML
6.0000 mg | Freq: Once | SUBCUTANEOUS | Status: AC
  Administered 2013-06-06: 6 mg via SUBCUTANEOUS
  Filled 2013-06-06: qty 0.6

## 2013-06-06 NOTE — Progress Notes (Signed)
Quick Note:  Call patient: call in potassium (K-dur) 20 meq po BID x 5 days ______

## 2013-06-12 ENCOUNTER — Telehealth: Payer: Self-pay | Admitting: Medical Oncology

## 2013-06-12 ENCOUNTER — Ambulatory Visit: Payer: Medicare Other | Admitting: Oncology

## 2013-06-12 ENCOUNTER — Other Ambulatory Visit: Payer: Medicare Other | Admitting: Lab

## 2013-06-12 NOTE — Telephone Encounter (Signed)
ok 

## 2013-06-12 NOTE — Telephone Encounter (Signed)
Per MD, called patient to inform her potassium low and to start potassium 20 meq by mouth twice a day for 5 days, Daughter informed me patient was admitted to [facility] hospital this morning by Dr Tylene Fantasia d/t laceration on toe that "has gotten 10 times worse." Daughter states pt in hospital to receive IV antibiotics.  Daughter cancelling today's appt with MD and Lab. No confirmation at this point regarding potassium by daughter.  Next sched appt with lab/NP/tx  06/26/13  mssg forwarded to MD/NP

## 2013-06-12 NOTE — Telephone Encounter (Signed)
Message copied by Rexene Edison on Mon Jun 12, 2013  9:46 AM ------      Message from: Monica Compton      Created: Tue Jun 06, 2013  4:54 PM       Call patient: call in potassium (K-dur) 20 meq po BID x 5 days ------

## 2013-06-14 ENCOUNTER — Encounter: Payer: Self-pay | Admitting: Adult Health

## 2013-06-16 ENCOUNTER — Ambulatory Visit: Payer: Medicaid Other | Admitting: Gynecology

## 2013-06-20 ENCOUNTER — Encounter: Payer: Self-pay | Admitting: Adult Health

## 2013-06-22 ENCOUNTER — Telehealth: Payer: Self-pay | Admitting: *Deleted

## 2013-06-22 ENCOUNTER — Telehealth: Payer: Self-pay | Admitting: Oncology

## 2013-06-22 ENCOUNTER — Other Ambulatory Visit: Payer: Self-pay | Admitting: Emergency Medicine

## 2013-06-22 NOTE — Telephone Encounter (Signed)
Per staff message and POF I have scheduled appts.  JMW  

## 2013-06-22 NOTE — Telephone Encounter (Signed)
, °

## 2013-06-23 ENCOUNTER — Telehealth: Payer: Self-pay | Admitting: *Deleted

## 2013-06-23 NOTE — Telephone Encounter (Signed)
Call from [place], [place] with [facility], requesting VO for pt to have physical therapy 2x week for 2 weeks. Reviewed with MD, VO to proceed with PT 2x week for 2 weeks. Notified Antonio, Rn with [facility].

## 2013-06-26 ENCOUNTER — Other Ambulatory Visit: Payer: Medicare Other | Admitting: Lab

## 2013-06-26 ENCOUNTER — Ambulatory Visit: Payer: Medicare Other | Admitting: Adult Health

## 2013-06-26 ENCOUNTER — Ambulatory Visit: Payer: Medicare Other

## 2013-07-03 ENCOUNTER — Other Ambulatory Visit (HOSPITAL_BASED_OUTPATIENT_CLINIC_OR_DEPARTMENT_OTHER): Payer: Medicare Other | Admitting: Lab

## 2013-07-03 ENCOUNTER — Ambulatory Visit (HOSPITAL_BASED_OUTPATIENT_CLINIC_OR_DEPARTMENT_OTHER): Payer: Medicare Other | Admitting: Oncology

## 2013-07-03 ENCOUNTER — Telehealth: Payer: Self-pay | Admitting: *Deleted

## 2013-07-03 ENCOUNTER — Ambulatory Visit: Payer: Medicare Other

## 2013-07-03 VITALS — BP 124/73 | HR 96 | Temp 98.2°F | Resp 20 | Ht 67.0 in | Wt 255.2 lb

## 2013-07-03 DIAGNOSIS — E119 Type 2 diabetes mellitus without complications: Secondary | ICD-10-CM

## 2013-07-03 DIAGNOSIS — C541 Malignant neoplasm of endometrium: Secondary | ICD-10-CM

## 2013-07-03 DIAGNOSIS — L089 Local infection of the skin and subcutaneous tissue, unspecified: Secondary | ICD-10-CM

## 2013-07-03 DIAGNOSIS — C649 Malignant neoplasm of unspecified kidney, except renal pelvis: Secondary | ICD-10-CM

## 2013-07-03 DIAGNOSIS — C549 Malignant neoplasm of corpus uteri, unspecified: Secondary | ICD-10-CM

## 2013-07-03 LAB — CBC WITH DIFFERENTIAL/PLATELET
Basophils Absolute: 0.1 10*3/uL (ref 0.0–0.1)
Eosinophils Absolute: 0.1 10*3/uL (ref 0.0–0.5)
HCT: 28.4 % — ABNORMAL LOW (ref 34.8–46.6)
HGB: 9.3 g/dL — ABNORMAL LOW (ref 11.6–15.9)
LYMPH%: 24.9 % (ref 14.0–49.7)
MCHC: 32.7 g/dL (ref 31.5–36.0)
MONO#: 0.6 10*3/uL (ref 0.1–0.9)
NEUT%: 66.1 % (ref 38.4–76.8)
Platelets: 296 10*3/uL (ref 145–400)
WBC: 9.7 10*3/uL (ref 3.9–10.3)
lymph#: 2.4 10*3/uL (ref 0.9–3.3)

## 2013-07-03 LAB — COMPREHENSIVE METABOLIC PANEL (CC13)
ALT: 10 U/L (ref 0–55)
Anion Gap: 11 mEq/L (ref 3–11)
BUN: 12.1 mg/dL (ref 7.0–26.0)
CO2: 31 mEq/L — ABNORMAL HIGH (ref 22–29)
Calcium: 9.1 mg/dL (ref 8.4–10.4)
Chloride: 104 mEq/L (ref 98–109)
Creatinine: 0.8 mg/dL (ref 0.6–1.1)
Glucose: 186 mg/dl — ABNORMAL HIGH (ref 70–140)
Total Bilirubin: 0.58 mg/dL (ref 0.20–1.20)

## 2013-07-03 NOTE — Telephone Encounter (Signed)
appts made and printed. Pt is aware that tx will be added. i emailed MW to add the tx...td 

## 2013-07-11 NOTE — Progress Notes (Signed)
OFFICE PROGRESS NOTE  CCGarlon Hatchet, MD No address on file Dr. Emmaline Kluver  DIAGNOSIS: 77 year old female with grade 1 endometrial cancer diagnosed June 2014   PRIOR THERAPY:  #1 She had been having abnormal bleeding for a number of months. She underwent an evaluation which included endometrial biopsy and D&C. The patient and on 03/15/2013 underwent microscopic supra-cervical hysterectomy which required morcellation to remove the uterus. The final pathology revealed a 6 cm aggregate tumor size. Of invasion could not be determined the tubes and ovaries appeared not to be involved with cancer  #2 She was seen by Dr. Loree Fee who has recommended adjuvant chemotherapy consisting of Taxol and carboplatinum. Patient is here to begin cycle 1 day 1 of Taxol carboplatinum. Total of 3 cycles is planned initially. This will then be followed by radiation therapy and then another 3 cycles of chemotherapy.   CURRENT THERAPY: status postCycle 2  of Taxol carboplatinum adjuvantly  INTERVAL HISTORY: Monica Compton 78 y.o. female returns for followup visit. Patient has developed infections of her feet. She is a known diabetic. She did require some debridement. Due to this we will hold her chemotherapy at this time. She is finishing of oral antibiotics. She has no pain she has no fevers chills or night sweats. No nausea or vomiting remainder of the 10 point review of systems is negative.  MEDICAL HISTORY: Past Medical History  Diagnosis Date  . Hypertension   . Diabetes mellitus without complication   . Kidney tumor   . Arthritis   . A-fib   . Cancer 03/2013    Endometrial cancer  . Hx of migraines   . CHF (congestive heart failure)   . Neuropathy     ALLERGIES:  is allergic to statins and sulfur.  MEDICATIONS:  Current Outpatient Prescriptions  Medication Sig Dispense Refill  . amoxicillin-clavulanate (AUGMENTIN) 250-125 MG per tablet Take 1 tablet by mouth daily.       . Cholecalciferol (VITAMIN D-3) 1000 UNITS CAPS Take 1 capsule by mouth daily.       . digoxin (LANOXIN) 0.125 MG tablet Take 0.125 mg by mouth every Monday, Wednesday, and Friday.       . ferrous sulfate 325 (65 FE) MG tablet Take 325 mg by mouth daily with breakfast.      . fish oil-omega-3 fatty acids 1000 MG capsule Take 1 g by mouth 2 (two) times daily.       . folic acid (FOLVITE) 1 MG tablet Take 1 mg by mouth daily.      . furosemide (LASIX) 80 MG tablet Take 120 mg by mouth daily.       Marland Kitchen gabapentin (NEURONTIN) 300 MG capsule Take 300 mg by mouth 3 (three) times daily.       . insulin glargine (LANTUS) 100 UNIT/ML injection Inject 40-51 Units into the skin 2 (two) times daily. 51u in am, 40u in pm      . insulin lispro (HUMALOG) 100 UNIT/ML injection Inject 8-9 Units into the skin 2 (two) times daily. 8u in am, 9u in pm      . lidocaine-prilocaine (EMLA) cream Apply topically as needed.  30 g  6  . potassium chloride SA (K-DUR,KLOR-CON) 20 MEQ tablet Take 20 mEq by mouth 2 (two) times daily.       . rosuvastatin (CRESTOR) 10 MG tablet Take 10 mg by mouth daily.      . traMADol (ULTRAM) 50 MG tablet Take 50 mg by mouth  2 (two) times daily as needed for pain.       Marland Kitchen UNABLE TO FIND Apply 1 application topically once. Med Name: cranial prosthesis  1 application  0  . verapamil (CALAN-SR) 180 MG CR tablet Take 180 mg by mouth 2 (two) times daily.      . vitamin B-12 (CYANOCOBALAMIN) 1000 MCG tablet Take 1,000 mcg by mouth daily.      . vitamin C (ASCORBIC ACID) 500 MG tablet Take 500 mg by mouth daily.      Marland Kitchen warfarin (COUMADIN) 7.5 MG tablet Take 3.75-7.5 mg by mouth daily. 1 tab daily except 0.5 tab on Tues and Thurs      . ciprofloxacin (CIPRO) 500 MG tablet Take 1 tablet (500 mg total) by mouth 2 (two) times daily.  14 tablet  0  . dexamethasone (DECADRON) 4 MG tablet Take 2 tablets (8 mg total) by mouth 2 (two) times daily with a meal. Take two times a day starting the day after  chemotherapy for 3 days.  30 tablet  1  . hydrOXYzine (ATARAX/VISTARIL) 25 MG tablet       . LORazepam (ATIVAN) 0.5 MG tablet Take 1 tablet (0.5 mg total) by mouth every 6 (six) hours as needed (Nausea or vomiting).  30 tablet  0  . ondansetron (ZOFRAN) 8 MG tablet Take 1 tablet (8 mg total) by mouth 2 (two) times daily. Take two times a day starting the day after chemo for 3 days. Then take two times a day as needed for nausea or vomiting.  30 tablet  1  . potassium chloride (K-DUR) 10 MEQ tablet Take 10 mEq by mouth 2 (two) times daily.      . prochlorperazine (COMPAZINE) 10 MG tablet Take 1 tablet (10 mg total) by mouth every 6 (six) hours as needed (Nausea or vomiting).  30 tablet  1   No current facility-administered medications for this visit.   Facility-Administered Medications Ordered in Other Visits  Medication Dose Route Frequency Provider Last Rate Last Dose  . sodium chloride 0.9 % injection 10 mL  10 mL Intracatheter PRN Victorino December, MD        SURGICAL HISTORY:  Past Surgical History  Procedure Laterality Date  . Tumor removal  2012    Kidney  . Replacement total knee bilateral      10 yrs ago, 29 yrs ago  . Ankle surgery Left   . Abdominal hysterectomy  03/15/2013  . Dilation and curettage of uterus  12/2012  . Hernia repair      x2    REVIEW OF SYSTEMS:  Pertinent items are noted in HPI.    PHYSICAL EXAMINATION: Blood pressure 124/73, pulse 96, temperature 98.2 F (36.8 C), temperature source Oral, resp. rate 20, height 5\' 7"  (1.702 m), weight 255 lb 3.2 oz (115.758 kg). Body mass index is 39.96 kg/(m^2). General: Patient is a well appearing female in no acute distress HEENT: PERRLA, sclerae anicteric no conjunctival pallor, MMM Neck: supple, no palpable adenopathy Lungs: clear to auscultation bilaterally, no wheezes, rhonchi, or rales Cardiovascular: regular rate rhythm, S1, S2, no murmurs, rubs or gallops Abdomen: Soft, non-tender, non-distended, normoactive  bowel sounds, no HSM Extremities: warm and well perfused, no clubbing, cyanosis, or edema Skin: No rashes or lesions Neuro: Non-focal ECOG PERFORMANCE STATUS: 1 - Symptomatic but completely ambulatory    LABORATORY DATA: Lab Results  Component Value Date   WBC 9.7 07/03/2013   HGB 9.3* 07/03/2013   HCT 28.4*  07/03/2013   MCV 83.2 07/03/2013   PLT 296 07/03/2013      Chemistry      Component Value Date/Time   NA 146* 07/03/2013 1157   K 3.6 07/03/2013 1157   CO2 31* 07/03/2013 1157   BUN 12.1 07/03/2013 1157   CREATININE 0.8 07/03/2013 1157      Component Value Date/Time   CALCIUM 9.1 07/03/2013 1157   ALKPHOS 81 07/03/2013 1157   AST 12 07/03/2013 1157   ALT 10 07/03/2013 1157   BILITOT 0.58 07/03/2013 1157       RADIOGRAPHIC STUDIES:  Nm Pet Image Initial (pi) Skull Base To Thigh  04/21/2013   *RADIOLOGY REPORT*  Clinical Data: Initial treatment strategy for endometrial carcinoma.  Endometrial carcinoma  NUCLEAR MEDICINE PET SKULL BASE TO THIGH  Fasting Blood Glucose:  162  Technique:  18.3 mCi F-18 FDG was injected intravenously.   CT data was obtained and used for attenuation correction and anatomic localization only.  (This was not acquired as a diagnostic CT examination.) Additional exam technical data entered  on technologist worksheet.  Comparison: None  Findings:  Neck: No hypermetabolic nodes in the neck.  Chest: No hypermetabolic mediastinal or hilar nodes.  No suspicious pulmonary nodules.  Abdomen / Pelvis:Post hysterectomy. Small common iliac and nodes on the right measure less than 10 mm (image 185).  No hypermetabolic pelvic lymph nodes.   No abnormal hypermetabolic activity within the solid organs.  No evidence of abdominal or pelvic hypermetabolic nodes.  Skeleton:No focal hypermetabolic activity to suggest skeletal metastasis.  IMPRESSION: No evidence of local endometrial cancer metastasis or distant metastasis.   Original Report Authenticated By: Genevive Bi, M.D.   Ir Fluoro Guide Cv Line Right  05/08/2013   *RADIOLOGY REPORT*  Clinical Data:Endometrial carcinoma, needs access for chemotherapy.  TUNNELED PORT CATHETER PLACEMENT WITH ULTRASOUND AND FLUOROSCOPIC GUIDANCE  Technique: The procedure, risks, benefits, and alternatives were explained to the patient.  Questions regarding the procedure were encouraged and answered.  The patient understands and consents to the procedure.  As antibiotic prophylaxis, cefazolin 2 grams was ordered pre- procedure and administered intravenously within one hour of incision.  Patency of the right IJ vein was confirmed with ultrasound with image documentation. An appropriate skin site was determined. Skin site was marked. Region was prepped using maximum barrier technique including cap and mask, sterile gown, sterile gloves, large sterile sheet, and Chlorhexidine   as cutaneous antisepsis.   The region was infiltrated locally with 1% lidocaine.  Intravenous Fentanyl and Versed were administered as conscious sedation during continuous cardiorespiratory monitoring by the radiology RN, with a total moderate sedation time of 20 minutes.  Under real-time ultrasound guidance, the right IJ vein was accessed with a 21 gauge micropuncture needle; the needle tip within the vein was confirmed with ultrasound image documentation.   Needle was exchanged over a 018 guidewire for transitional dilator which allowed passage of the [facility] wire into the IVC. Over this, the transitional dilator was exchanged for a 5 [place] MPA catheter. A small incision was made on the right anterior chest wall and a subcutaneous pocket fashioned. The power-injectable port was positioned and its catheter tunneled to the right IJ dermatotomy site. The MPA catheter was exchanged over an Amplatz wire for a peel-away sheath, through which the port catheter, which had been trimmed to the appropriate length, was advanced and positioned under fluoroscopy with its tip  at the cavoatrial junction. Spot chest radiograph  confirms good catheter position  and no pneumothorax. The pocket was closed with deep interrupted and subcuticular continuous 3-0 Monocryl sutures. The port was  flushed per protocol. The incisions were covered with Dermabond then covered with a sterile dressing. No immediate complication.  Fluoroscopy time: 6 seconds  IMPRESSION Technically successful right IJ power-injectable port catheter placement. Ready for routine use.   Original Report Authenticated By: D. Andria Rhein, MD   Ir US Guide Vasc Access Right  05/08/2013   *RADIOLOGY REPORT*  Clinical Data:Endometrial carcinoma, needs access for chemotherapy.  TUNNELED PORT CATHETER PLACEMENT WITH ULTRASOUND AND FLUOROSCOPIC GUIDANCE  Technique: The procedure, risks, benefits, and alternatives were explained to the patient.  Questions regarding the procedure were encouraged and answered.  The patient understands and consents to the procedure.  As antibiotic prophylaxis, cefazolin 2 grams was ordered pre- procedure and administered intravenously within one hour of incision.  Patency of the right IJ vein was confirmed with ultrasound with image documentation. An appropriate skin site was determined. Skin site was marked. Region was prepped using maximum barrier technique including cap and mask, sterile gown, sterile gloves, large sterile sheet, and Chlorhexidine   as cutaneous antisepsis.   The region was infiltrated locally with 1% lidocaine.  Intravenous Fentanyl and Versed were administered as conscious sedation during continuous cardiorespiratory monitoring by the radiology RN, with a total moderate sedation time of 20 minutes.  Under real-time ultrasound guidance, the right IJ vein was accessed with a 21 gauge micropuncture needle; the needle tip within the vein was confirmed with ultrasound image documentation.   Needle was exchanged over a 018 guidewire for transitional dilator which allowed passage of the  [facility] wire into the IVC. Over this, the transitional dilator was exchanged for a 5 [place] MPA catheter. A small incision was made on the right anterior chest wall and a subcutaneous pocket fashioned. The power-injectable port was positioned and its catheter tunneled to the right IJ dermatotomy site. The MPA catheter was exchanged over an Amplatz wire for a peel-away sheath, through which the port catheter, which had been trimmed to the appropriate length, was advanced and positioned under fluoroscopy with its tip at the cavoatrial junction. Spot chest radiograph  confirms good catheter position and no pneumothorax. The pocket was closed with deep interrupted and subcuticular continuous 3-0 Monocryl sutures. The port was  flushed per protocol. The incisions were covered with Dermabond then covered with a sterile dressing. No immediate complication.  Fluoroscopy time: 6 seconds  IMPRESSION Technically successful right IJ power-injectable port catheter placement. Ready for routine use.   Original Report Authenticated By: D. Andria Rhein, MD    ASSESSMENT: 77 year old female with  #1 grade 1 endometrial carcinoma diagnosed June 2014. Patient is status post supracervical hysterectomy that required morcellation to remove the uterus. Size of tumor was 6 cm.  #2 patient is at high risk for development of recurrent disease and therefore she is recommended adjuvant chemotherapy consisting of Taxol and carboplatinum.  #3bilateral foot infections due to diabetes  PLAN:    #1 we will hold patient's chemotherapy until her feet issues resolved.  #2 she'll be seen back on 07/17/2013 for followup  All questions were answered. The patient knows to call the clinic with any problems, questions or concerns. We can certainly see the patient much sooner if necessary.  I spent 15 minutes counseling the patient face to face. The total time spent in the appointment was 20 minutes.   Drue Second,  MD Medical/Oncology [facility] [phone] (beeper) [phone] (  Office)

## 2013-07-17 ENCOUNTER — Other Ambulatory Visit (HOSPITAL_BASED_OUTPATIENT_CLINIC_OR_DEPARTMENT_OTHER): Payer: Medicare Other | Admitting: Lab

## 2013-07-17 ENCOUNTER — Encounter: Payer: Self-pay | Admitting: Adult Health

## 2013-07-17 ENCOUNTER — Ambulatory Visit (HOSPITAL_BASED_OUTPATIENT_CLINIC_OR_DEPARTMENT_OTHER): Payer: Medicare Other | Admitting: Adult Health

## 2013-07-17 VITALS — BP 95/59 | HR 103 | Temp 98.3°F | Resp 20 | Ht 67.0 in | Wt 251.3 lb

## 2013-07-17 DIAGNOSIS — C549 Malignant neoplasm of corpus uteri, unspecified: Secondary | ICD-10-CM

## 2013-07-17 DIAGNOSIS — E119 Type 2 diabetes mellitus without complications: Secondary | ICD-10-CM

## 2013-07-17 DIAGNOSIS — C541 Malignant neoplasm of endometrium: Secondary | ICD-10-CM

## 2013-07-17 DIAGNOSIS — L089 Local infection of the skin and subcutaneous tissue, unspecified: Secondary | ICD-10-CM

## 2013-07-17 LAB — CBC WITH DIFFERENTIAL/PLATELET
BASO%: 0.3 % (ref 0.0–2.0)
EOS%: 1.4 % (ref 0.0–7.0)
Eosinophils Absolute: 0.1 10*3/uL (ref 0.0–0.5)
LYMPH%: 29 % (ref 14.0–49.7)
MCH: 26.3 pg (ref 25.1–34.0)
MCHC: 30.4 g/dL — ABNORMAL LOW (ref 31.5–36.0)
MCV: 86.3 fL (ref 79.5–101.0)
MONO#: 0.6 10*3/uL (ref 0.1–0.9)
MONO%: 6.3 % (ref 0.0–14.0)
NEUT#: 5.7 10*3/uL (ref 1.5–6.5)
Platelets: 290 10*3/uL (ref 145–400)
RBC: 3.58 10*6/uL — ABNORMAL LOW (ref 3.70–5.45)
RDW: 21 % — ABNORMAL HIGH (ref 11.2–14.5)
WBC: 9 10*3/uL (ref 3.9–10.3)

## 2013-07-17 LAB — COMPREHENSIVE METABOLIC PANEL (CC13)
ALT: 9 U/L (ref 0–55)
AST: 13 U/L (ref 5–34)
Albumin: 2.5 g/dL — ABNORMAL LOW (ref 3.5–5.0)
Alkaline Phosphatase: 71 U/L (ref 40–150)
Anion Gap: 11 mEq/L (ref 3–11)
CO2: 28 mEq/L (ref 22–29)
Creatinine: 1 mg/dL (ref 0.6–1.1)
Glucose: 142 mg/dl — ABNORMAL HIGH (ref 70–140)
Potassium: 3.7 mEq/L (ref 3.5–5.1)
Sodium: 145 mEq/L (ref 136–145)
Total Bilirubin: 0.7 mg/dL (ref 0.20–1.20)
Total Protein: 7 g/dL (ref 6.4–8.3)

## 2013-07-17 NOTE — Progress Notes (Addendum)
OFFICE PROGRESS NOTE  CCGarlon Hatchet, MD No address on file Dr. Emmaline Kluver  DIAGNOSIS: 77 year old female with grade 1 endometrial cancer diagnosed June 2014   PRIOR THERAPY:  #1 She had been having abnormal bleeding for a number of months. She underwent an evaluation which included endometrial biopsy and D&C. The patient and on 03/15/2013 underwent microscopic supra-cervical hysterectomy which required morcellation to remove the uterus. The final pathology revealed a 6 cm aggregate tumor size. Of invasion could not be determined the tubes and ovaries appeared not to be involved with cancer  #2 She was seen by Dr. Loree Fee who has recommended adjuvant chemotherapy consisting of Taxol and carboplatinum. Patient is here to receive Taxol carboplatinum. Total of 3 cycles is planned initially. This will then be followed by radiation therapy and then another 3 cycles of chemotherapy.  We are holding the third cycle indefinitely until her bilateral toe infections have healed.     CURRENT THERAPY: cycle 3 day 1 Taxol/carbo  INTERVAL HISTORY: Monica Compton 77 y.o. female returns for followup visit. She is doing well today.  She completed a 30 day course of antibiotics for bilateral lower extremity wounds (including one week of IV antibiotics).  She has mild numbness/tingling in her fingertips and toes.  She does have occasional difficulty with buttoning.  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  The wounds on her toes are dressed, and home health helps her with this.  Dr. Steward Ros is following her closely.    MEDICAL HISTORY: Past Medical History  Diagnosis Date  . Hypertension   . Diabetes mellitus without complication   . Kidney tumor   . Arthritis   . A-fib   . Cancer 03/2013    Endometrial cancer  . Hx of migraines   . CHF (congestive heart failure)   . Neuropathy     ALLERGIES:  is allergic to statins; sulfur; and ciprofloxacin  hcl.  MEDICATIONS:  Current Outpatient Prescriptions  Medication Sig Dispense Refill  . Cholecalciferol (VITAMIN D-3) 1000 UNITS CAPS Take 1 capsule by mouth daily.       Marland Kitchen dexamethasone (DECADRON) 4 MG tablet Take 2 tablets (8 mg total) by mouth 2 (two) times daily with a meal. Take two times a day starting the day after chemotherapy for 3 days.  30 tablet  1  . digoxin (LANOXIN) 0.125 MG tablet Take 0.125 mg by mouth every Monday, Wednesday, and Friday.       . ferrous sulfate 325 (65 FE) MG tablet Take 325 mg by mouth daily with breakfast.      . fish oil-omega-3 fatty acids 1000 MG capsule Take 1 g by mouth 2 (two) times daily.       . folic acid (FOLVITE) 1 MG tablet Take 1 mg by mouth daily.      . furosemide (LASIX) 80 MG tablet Take 120 mg by mouth daily.       Marland Kitchen gabapentin (NEURONTIN) 300 MG capsule Take 300 mg by mouth 3 (three) times daily.       . hydrOXYzine (ATARAX/VISTARIL) 25 MG tablet       . insulin glargine (LANTUS) 100 UNIT/ML injection Inject 40-51 Units into the skin 2 (two) times daily. 51u in am, 40u in pm      . insulin lispro (HUMALOG) 100 UNIT/ML injection Inject 8-9 Units into the skin 2 (two) times daily. 8u in am, 9u in pm      . lidocaine-prilocaine (EMLA)  cream Apply topically as needed.  30 g  6  . LORazepam (ATIVAN) 0.5 MG tablet Take 1 tablet (0.5 mg total) by mouth every 6 (six) hours as needed (Nausea or vomiting).  30 tablet  0  . ondansetron (ZOFRAN) 8 MG tablet Take 1 tablet (8 mg total) by mouth 2 (two) times daily. Take two times a day starting the day after chemo for 3 days. Then take two times a day as needed for nausea or vomiting.  30 tablet  1  . potassium chloride SA (K-DUR,KLOR-CON) 20 MEQ tablet Take 20 mEq by mouth 2 (two) times daily.       . prochlorperazine (COMPAZINE) 10 MG tablet Take 1 tablet (10 mg total) by mouth every 6 (six) hours as needed (Nausea or vomiting).  30 tablet  1  . rosuvastatin (CRESTOR) 10 MG tablet Take 10 mg by mouth  daily.      . traMADol (ULTRAM) 50 MG tablet Take 50 mg by mouth 2 (two) times daily as needed for pain.       Marland Kitchen UNABLE TO FIND Apply 1 application topically once. Med Name: cranial prosthesis  1 application  0  . verapamil (CALAN-SR) 180 MG CR tablet Take 180 mg by mouth 2 (two) times daily.      . vitamin B-12 (CYANOCOBALAMIN) 1000 MCG tablet Take 1,000 mcg by mouth daily.      . vitamin C (ASCORBIC ACID) 500 MG tablet Take 500 mg by mouth daily.      Marland Kitchen warfarin (COUMADIN) 7.5 MG tablet Take 3.75-7.5 mg by mouth daily. 1 tab daily except 0.5 tab on Tues and Thurs       No current facility-administered medications for this visit.   Facility-Administered Medications Ordered in Other Visits  Medication Dose Route Frequency Provider Last Rate Last Dose  . sodium chloride 0.9 % injection 10 mL  10 mL Intracatheter PRN Victorino December, MD        SURGICAL HISTORY:  Past Surgical History  Procedure Laterality Date  . Tumor removal  2012    Kidney  . Replacement total knee bilateral      10 yrs ago, 29 yrs ago  . Ankle surgery Left   . Abdominal hysterectomy  03/15/2013  . Dilation and curettage of uterus  12/2012  . Hernia repair      x2    REVIEW OF SYSTEMS:  A 10 point review of systems was conducted and is otherwise negative except for what is noted above.      PHYSICAL EXAMINATION: Blood pressure 95/59, pulse 103, temperature 98.3 F (36.8 C), temperature source Oral, resp. rate 20, height 5\' 7"  (1.702 m), weight 251 lb 4.8 oz (113.989 kg). Body mass index is 39.35 kg/(m^2). General: Patient is a well appearing female in no acute distress HEENT: PERRLA, sclerae anicteric no conjunctival pallor, MMM Neck: supple, no palpable adenopathy Lungs: clear to auscultation bilaterally, no wheezes, rhonchi, or rales Cardiovascular: regular rate rhythm, S1, S2, no murmurs, rubs or gallops Abdomen: Soft, non-tender, non-distended, normoactive bowel sounds, no HSM Extremities: warm and well  perfused, no clubbing, cyanosis, or edema Skin: No rashes or lesions, bilateral great toes with wounds, removed dressing, erythema is extending up the right great toe towards the foot and is warm.  Quarter sized lesion on pad of right toe with foul smelling drainage, left great toe lesion on pad of toe appears to be healing, mild erythema surrounding, but none extending up the left toe Neuro:  Non-focal ECOG PERFORMANCE STATUS: 1 - Symptomatic but completely ambulatory    LABORATORY DATA: Lab Results  Component Value Date   WBC 9.0 07/17/2013   HGB 9.4* 07/17/2013   HCT 30.9* 07/17/2013   MCV 86.3 07/17/2013   PLT 290 07/17/2013      Chemistry      Component Value Date/Time   NA 146* 07/03/2013 1157   K 3.6 07/03/2013 1157   CO2 31* 07/03/2013 1157   BUN 12.1 07/03/2013 1157   CREATININE 0.8 07/03/2013 1157      Component Value Date/Time   CALCIUM 9.1 07/03/2013 1157   ALKPHOS 81 07/03/2013 1157   AST 12 07/03/2013 1157   ALT 10 07/03/2013 1157   BILITOT 0.58 07/03/2013 1157       RADIOGRAPHIC STUDIES:  Nm Pet Image Initial (pi) Skull Base To Thigh  04/21/2013   *RADIOLOGY REPORT*  Clinical Data: Initial treatment strategy for endometrial carcinoma.  Endometrial carcinoma  NUCLEAR MEDICINE PET SKULL BASE TO THIGH  Fasting Blood Glucose:  162  Technique:  18.3 mCi F-18 FDG was injected intravenously.   CT data was obtained and used for attenuation correction and anatomic localization only.  (This was not acquired as a diagnostic CT examination.) Additional exam technical data entered  on technologist worksheet.  Comparison: None  Findings:  Neck: No hypermetabolic nodes in the neck.  Chest: No hypermetabolic mediastinal or hilar nodes.  No suspicious pulmonary nodules.  Abdomen / Pelvis:Post hysterectomy. Small common iliac and nodes on the right measure less than 10 mm (image 185).  No hypermetabolic pelvic lymph nodes.   No abnormal hypermetabolic activity within the solid  organs.  No evidence of abdominal or pelvic hypermetabolic nodes.  Skeleton:No focal hypermetabolic activity to suggest skeletal metastasis.  IMPRESSION: No evidence of local endometrial cancer metastasis or distant metastasis.   Original Report Authenticated By: Genevive Bi, M.D.   Ir Fluoro Guide Cv Line Right  05/08/2013   *RADIOLOGY REPORT*  Clinical Data:Endometrial carcinoma, needs access for chemotherapy.  TUNNELED PORT CATHETER PLACEMENT WITH ULTRASOUND AND FLUOROSCOPIC GUIDANCE  Technique: The procedure, risks, benefits, and alternatives were explained to the patient.  Questions regarding the procedure were encouraged and answered.  The patient understands and consents to the procedure.  As antibiotic prophylaxis, cefazolin 2 grams was ordered pre- procedure and administered intravenously within one hour of incision.  Patency of the right IJ vein was confirmed with ultrasound with image documentation. An appropriate skin site was determined. Skin site was marked. Region was prepped using maximum barrier technique including cap and mask, sterile gown, sterile gloves, large sterile sheet, and Chlorhexidine   as cutaneous antisepsis.   The region was infiltrated locally with 1% lidocaine.  Intravenous Fentanyl and Versed were administered as conscious sedation during continuous cardiorespiratory monitoring by the radiology RN, with a total moderate sedation time of 20 minutes.  Under real-time ultrasound guidance, the right IJ vein was accessed with a 21 gauge micropuncture needle; the needle tip within the vein was confirmed with ultrasound image documentation.   Needle was exchanged over a 018 guidewire for transitional dilator which allowed passage of the [facility] wire into the IVC. Over this, the transitional dilator was exchanged for a 5 [place] MPA catheter. A small incision was made on the right anterior chest wall and a subcutaneous pocket fashioned. The power-injectable port was positioned and its  catheter tunneled to the right IJ dermatotomy site. The MPA catheter was exchanged over an Amplatz wire for a peel-away  sheath, through which the port catheter, which had been trimmed to the appropriate length, was advanced and positioned under fluoroscopy with its tip at the cavoatrial junction. Spot chest radiograph  confirms good catheter position and no pneumothorax. The pocket was closed with deep interrupted and subcuticular continuous 3-0 Monocryl sutures. The port was  flushed per protocol. The incisions were covered with Dermabond then covered with a sterile dressing. No immediate complication.  Fluoroscopy time: 6 seconds  IMPRESSION Technically successful right IJ power-injectable port catheter placement. Ready for routine use.   Original Report Authenticated By: D. Andria Rhein, MD   Ir US Guide Vasc Access Right  05/08/2013   *RADIOLOGY REPORT*  Clinical Data:Endometrial carcinoma, needs access for chemotherapy.  TUNNELED PORT CATHETER PLACEMENT WITH ULTRASOUND AND FLUOROSCOPIC GUIDANCE  Technique: The procedure, risks, benefits, and alternatives were explained to the patient.  Questions regarding the procedure were encouraged and answered.  The patient understands and consents to the procedure.  As antibiotic prophylaxis, cefazolin 2 grams was ordered pre- procedure and administered intravenously within one hour of incision.  Patency of the right IJ vein was confirmed with ultrasound with image documentation. An appropriate skin site was determined. Skin site was marked. Region was prepped using maximum barrier technique including cap and mask, sterile gown, sterile gloves, large sterile sheet, and Chlorhexidine   as cutaneous antisepsis.   The region was infiltrated locally with 1% lidocaine.  Intravenous Fentanyl and Versed were administered as conscious sedation during continuous cardiorespiratory monitoring by the radiology RN, with a total moderate sedation time of 20 minutes.  Under real-time  ultrasound guidance, the right IJ vein was accessed with a 21 gauge micropuncture needle; the needle tip within the vein was confirmed with ultrasound image documentation.   Needle was exchanged over a 018 guidewire for transitional dilator which allowed passage of the [facility] wire into the IVC. Over this, the transitional dilator was exchanged for a 5 [place] MPA catheter. A small incision was made on the right anterior chest wall and a subcutaneous pocket fashioned. The power-injectable port was positioned and its catheter tunneled to the right IJ dermatotomy site. The MPA catheter was exchanged over an Amplatz wire for a peel-away sheath, through which the port catheter, which had been trimmed to the appropriate length, was advanced and positioned under fluoroscopy with its tip at the cavoatrial junction. Spot chest radiograph  confirms good catheter position and no pneumothorax. The pocket was closed with deep interrupted and subcuticular continuous 3-0 Monocryl sutures. The port was  flushed per protocol. The incisions were covered with Dermabond then covered with a sterile dressing. No immediate complication.  Fluoroscopy time: 6 seconds  IMPRESSION Technically successful right IJ power-injectable port catheter placement. Ready for routine use.   Original Report Authenticated By: D. Andria Rhein, MD    ASSESSMENT: 77 year old female with  #1 grade 1 endometrial carcinoma diagnosed June 2014. Patient is status post supracervical hysterectomy that required morcellation to remove the uterus. Size of tumor was 6 cm.  #2 patient is at high risk for development of recurrent disease and therefore she is recommended adjuvant chemotherapy consisting of Taxol and carboplatinum.  #3bilateral foot infections due to diabetes  PLAN:     #1 we will hold patient's chemotherapy until the wounds heal on her toes.  I have contacted Dr. Roselind Messier about possibly proceeding with radiation since we are holding chemotherapy  indefinitely. They will follow up with Dr. Steward Ros regarding the infection.  I counseled her on being more  careful with her feet, and walking.   #2 she'll return for follow up based on Dr. Trina Ao recommendations to modifying her treatment plan.    All questions were answered. The patient knows to call the clinic with any problems, questions or concerns. We can certainly see the patient much sooner if necessary.  I spent 25 minutes counseling the patient face to face. The total time spent in the appointment was 30 minutes.   Monica Level, NP Medical Oncology [facility] [phone]  ATTENDING'S ATTESTATION:  I personally reviewed patient's chart, examined patient myself, formulated the treatment plan as followed.    Patient continues to have significant issues with bilateral foot infections. My recommendation at this time is to continue holding her chemotherapy. Her daughter did suggest the possibility of the patient going on to radiation. And we will contact Dr. Sharlett Iles regarding this.  Patient will be seen back in a few weeks time for followup  Drue Second, MD Medical/Oncology [facility] [phone] (beeper) [phone] (Office)  07/25/2013, 9:10 AM

## 2013-07-18 ENCOUNTER — Ambulatory Visit: Payer: Medicare Other

## 2013-07-19 NOTE — Progress Notes (Signed)
GYN Location of Tumor / Histology: endometrial adenocarcinoma  Patient presented in June 2014 with symptoms of: postmenopausal bleeding.  Biopsies of uterus (if applicable) revealed: morcelated endometrial adenocarcinoma  Past/Anticipated interventions by Gyn/Onc surgery, if any: laproscopic supracervical hysterectomy on 03/15/2013  Past/Anticipated interventions by medical oncology, if any: Taxol carboplatinum. Total of 3 cycles is planned initially.  Had last dose two months ago. On hold now due to bilateral foot infections.  Weight changes, if any: none  Bowel/Bladder complaints, if any: none  Nausea/Vomiting, if any: none  Pain issues, if any:  Neuropathy in both hands.  SAFETY ISSUES:  Prior radiation? no  Pacemaker/ICD? no  Possible current pregnancy? no  Is the patient on methotrexate? no  Current Complaints / other details:  Patient here with her daughter.  Has ulcers on both feet.  Sees the surgeon every couple of weeks for debridement.

## 2013-07-20 ENCOUNTER — Encounter: Payer: Self-pay | Admitting: Radiation Oncology

## 2013-07-20 ENCOUNTER — Ambulatory Visit
Admission: RE | Admit: 2013-07-20 | Discharge: 2013-07-20 | Disposition: A | Discharge status: Home / Self Care | Payer: Medicare Other | Source: Ambulatory Visit | Attending: Radiation Oncology | Admitting: Radiation Oncology

## 2013-07-20 VITALS — BP 152/47 | HR 65 | Temp 98.0°F | Ht 67.0 in | Wt 250.4 lb

## 2013-07-20 DIAGNOSIS — Z79899 Other long term (current) drug therapy: Secondary | ICD-10-CM | POA: Insufficient documentation

## 2013-07-20 DIAGNOSIS — C549 Malignant neoplasm of corpus uteri, unspecified: Secondary | ICD-10-CM | POA: Insufficient documentation

## 2013-07-20 DIAGNOSIS — C541 Malignant neoplasm of endometrium: Secondary | ICD-10-CM

## 2013-07-20 NOTE — Progress Notes (Signed)
Radiation Oncology         [phone] ________________________________  Name: Monica Compton MRN: [id number]  Date: 07/20/2013  DOB: Jun 10, 1936  Follow-Up Visit Note  CC: Garlon Hatchet, MD  Victorino December, MD  Diagnosis:   Endometrial adenocarcinoma, FIGO grade 1  Narrative:  The patient returns today for further evaluation. The patient has been receiving adjuvant chemotherapy consisting of Taxol and carboplatinum. The patient has completed 2 of 6 planned treatments.  Her third cycle of chemotherapy was held  indefinitely until her bilateral toe infections have healed.  Patient has completed a 30 day course of antibiotics (including 1 weeks of IV antibiotics).  According to discussion with the patient and her daughter her surgeon Dr. Steward Ros feels the infection has cleared up. The patient is not on any antibiotics at this time. She however continues to have open wounds on both toes and is followed closely for this issue. Patient cannot restart her chemotherapy until her toes have healed.  In light of this issue the patient is now seen in radiation oncology to see if she can proceed earlier with her planned radiation treatments.  The patient denies any pelvic pain or vaginal bleeding. She denies any hematuria or rectal bleeding.                            ALLERGIES:  is allergic to statins; sulfur; and ciprofloxacin hcl.  Meds: Current Outpatient Prescriptions  Medication Sig Dispense Refill  . Cholecalciferol (VITAMIN D-3) 1000 UNITS CAPS Take 1 capsule by mouth daily.       Marland Kitchen dexamethasone (DECADRON) 4 MG tablet Take 2 tablets (8 mg total) by mouth 2 (two) times daily with a meal. Take two times a day starting the day after chemotherapy for 3 days.  30 tablet  1  . digoxin (LANOXIN) 0.125 MG tablet Take 0.125 mg by mouth every Monday, Wednesday, and Friday.       . ferrous sulfate 325 (65 FE) MG tablet Take 325 mg by mouth daily with breakfast.      . fish oil-omega-3 fatty  acids 1000 MG capsule Take 1 g by mouth 2 (two) times daily.       . furosemide (LASIX) 80 MG tablet Take 120 mg by mouth daily.       Marland Kitchen gabapentin (NEURONTIN) 300 MG capsule Take 300 mg by mouth 3 (three) times daily.       . hydrOXYzine (ATARAX/VISTARIL) 25 MG tablet       . insulin glargine (LANTUS) 100 UNIT/ML injection Inject 40-51 Units into the skin 2 (two) times daily. 51u in am, 40u in pm      . insulin lispro (HUMALOG) 100 UNIT/ML injection Inject 8-9 Units into the skin 2 (two) times daily. 8u in am, 9u in pm      . lidocaine-prilocaine (EMLA) cream Apply topically as needed.  30 g  6  . LORazepam (ATIVAN) 0.5 MG tablet Take 1 tablet (0.5 mg total) by mouth every 6 (six) hours as needed (Nausea or vomiting).  30 tablet  0  . ondansetron (ZOFRAN) 8 MG tablet Take 1 tablet (8 mg total) by mouth 2 (two) times daily. Take two times a day starting the day after chemo for 3 days. Then take two times a day as needed for nausea or vomiting.  30 tablet  1  . potassium chloride SA (K-DUR,KLOR-CON) 20 MEQ tablet Take 20 mEq by mouth 2 (two)  times daily.       . prochlorperazine (COMPAZINE) 10 MG tablet Take 1 tablet (10 mg total) by mouth every 6 (six) hours as needed (Nausea or vomiting).  30 tablet  1  . rosuvastatin (CRESTOR) 10 MG tablet Take 10 mg by mouth daily.      . traMADol (ULTRAM) 50 MG tablet Take 50 mg by mouth 2 (two) times daily as needed for pain.       Marland Kitchen UNABLE TO FIND Apply 1 application topically once. Med Name: cranial prosthesis  1 application  0  . verapamil (CALAN-SR) 180 MG CR tablet Take 180 mg by mouth 2 (two) times daily.      . vitamin B-12 (CYANOCOBALAMIN) 1000 MCG tablet Take 1,000 mcg by mouth daily.      . vitamin C (ASCORBIC ACID) 500 MG tablet Take 500 mg by mouth daily.      Marland Kitchen warfarin (COUMADIN) 7.5 MG tablet Take 3.75-7.5 mg by mouth daily. 1 tab daily except 0.5 tab on Tues and Thurs      . folic acid (FOLVITE) 1 MG tablet Take 1 mg by mouth daily.       No  current facility-administered medications for this encounter.   Facility-Administered Medications Ordered in Other Encounters  Medication Dose Route Frequency Provider Last Rate Last Dose  . sodium chloride 0.9 % injection 10 mL  10 mL Intracatheter PRN Victorino December, MD        Physical Findings: The patient is in no acute distress. Patient is alert and oriented.  height is 5\' 7"  (1.702 m) and weight is 250 lb 6.4 oz (113.581 kg). Her temperature is 98 F (36.7 C). Her blood pressure is 152/47 and her pulse is 65. Her oxygen saturation is 95%. .  No palpable supraclavicular or axillary adenopathy. The lungs are clear to auscultation. The heart has a regular rhythm and rate. Abdomen soft nontender with normal bowel sounds. There is no inguinal adenopathy appreciated. On pelvic examination the external genitalia are unremarkable. A speculum exam is performed which is difficult in light of patient's body habitus. The extra long Medal speculum was used and I had difficulty but eventually was able to identify the cervical os. On bimanual and rectovaginal examination I was unable to palpate the cervical region due to the patient's anatomy.  The patient has open wounds along the plantar aspects of both great toes. There is erythema involving both great toes.   Lab Findings: Lab Results  Component Value Date   WBC 9.0 07/17/2013   HGB 9.4* 07/17/2013   HCT 30.9* 07/17/2013   MCV 86.3 07/17/2013   PLT 290 07/17/2013     Radiographic Findings: No results found.  Impression:  Endometrial adenocarcinoma incidentally found at the time of a supracervical hysterectomy. I discussed with the patient 2 general options for radiation therapy management. We discussed external beam radiation therapy. We also discussed intracavitary brachytherapy treatments. In light of the patient's body habitus she would have to be taken to the operating room for placement of what I will anticipate  being a short tandem in the  residual cervix and a ring at the cervical os. This may require 5 trips to the operating room with anesthesia. I discussed the pros and cons of both these approaches with the patient. After careful consideration she does not wish to consider external beam radiation therapy at this time given her over 1 hour travel time each way for treatment.   she would like  to proceed with intracavitary brachytherapy treatments at this time. This will be scheduled with the assistance of gynecologic oncology. I doubt that the patient would be able to have a Schmitt sleeve placed but if so  this may potentially avoid multiple trips to the operating room with anesthesia.  Plan:  The patient will be scheduled for her first brachytherapy procedure in the near future with the assistance of gynecologic oncology at the time of her OR procedure.  _____________________________________ -----------------------------------  Billie Lade, PhD, MD

## 2013-07-20 NOTE — Progress Notes (Signed)
Radiation Oncology         [phone]) [phone] ________________________________  Preop Evaluation note  Name: Monica Compton MRN: [id number]  Date: 07/20/2013  DOB: 11/26/1935  ZH:[id number],HDQQIW, MD  Monica December, MD , De Blanch MD  REFERRING PHYSICIAN: Victorino December, MD  DIAGNOSIS: Endometrial cancer  HISTORY OF PRESENT ILLNESS::Monica Compton is a 77 y.o. female who is to be taken to the operating room for her first brachytherapy procedure as part of management of her endometrial cancer. At the time of the patient's supracervical hysterectomy she was found to have an endometrial cancer. It was recommended patient proceed with adjuvant chemotherapy and intracavitary brachytherapy treatments as part of her adjuvant postoperative treatments. The patient has completed 2 of 6 cycles chemotherapy. Her chemotherapy has been held in light of bilateral toe infections. Patient was on several weeks of antibiotics and one week of IV abs for this issue. She now presents for her first brachytherapy procedure. She will likely have a short tandem placed in the residual cervix with a HDR ring placed in the cervical os region.   PREVIOUS RADIATION THERAPY: No  PAST MEDICAL HISTORY:  has a past medical history of Hypertension; Diabetes mellitus without complication; Kidney tumor; Arthritis; A-fib; Cancer (03/2013); migraines; CHF (congestive heart failure); and Neuropathy.    PAST SURGICAL HISTORY: Past Surgical History  Procedure Laterality Date  . Tumor removal  2012    Kidney  . Replacement total knee bilateral      10 yrs ago, 29 yrs ago  . Ankle surgery Left   . Abdominal hysterectomy  03/15/2013  . Dilation and curettage of uterus  12/2012  . Hernia repair      x2    FAMILY HISTORY: family history includes Atrial fibrillation in her mother; Diabetes in her maternal grandmother and mother; Heart attack in her father; Hypertension in her mother; Stroke in her mother.  SOCIAL  HISTORY:  reports that she has never smoked. She has never used smokeless tobacco. She reports that she does not drink alcohol or use illicit drugs.  ALLERGIES: Statins; Sulfur; and Ciprofloxacin hcl  MEDICATIONS:  Current Outpatient Prescriptions  Medication Sig Dispense Refill  . Cholecalciferol (VITAMIN D-3) 1000 UNITS CAPS Take 1 capsule by mouth daily.       Marland Kitchen dexamethasone (DECADRON) 4 MG tablet Take 2 tablets (8 mg total) by mouth 2 (two) times daily with a meal. Take two times a day starting the day after chemotherapy for 3 days.  30 tablet  1  . digoxin (LANOXIN) 0.125 MG tablet Take 0.125 mg by mouth every Monday, Wednesday, and Friday.       . ferrous sulfate 325 (65 FE) MG tablet Take 325 mg by mouth daily with breakfast.      . fish oil-omega-3 fatty acids 1000 MG capsule Take 1 g by mouth 2 (two) times daily.       . furosemide (LASIX) 80 MG tablet Take 120 mg by mouth daily.       Marland Kitchen gabapentin (NEURONTIN) 300 MG capsule Take 300 mg by mouth 3 (three) times daily.       . hydrOXYzine (ATARAX/VISTARIL) 25 MG tablet       . insulin glargine (LANTUS) 100 UNIT/ML injection Inject 40-51 Units into the skin 2 (two) times daily. 51u in am, 40u in pm      . insulin lispro (HUMALOG) 100 UNIT/ML injection Inject 8-9 Units into the skin 2 (two) times daily. 8u in am, 9u  in pm      . lidocaine-prilocaine (EMLA) cream Apply topically as needed.  30 g  6  . LORazepam (ATIVAN) 0.5 MG tablet Take 1 tablet (0.5 mg total) by mouth every 6 (six) hours as needed (Nausea or vomiting).  30 tablet  0  . ondansetron (ZOFRAN) 8 MG tablet Take 1 tablet (8 mg total) by mouth 2 (two) times daily. Take two times a day starting the day after chemo for 3 days. Then take two times a day as needed for nausea or vomiting.  30 tablet  1  . potassium chloride SA (K-DUR,KLOR-CON) 20 MEQ tablet Take 20 mEq by mouth 2 (two) times daily.       . prochlorperazine (COMPAZINE) 10 MG tablet Take 1 tablet (10 mg total) by  mouth every 6 (six) hours as needed (Nausea or vomiting).  30 tablet  1  . rosuvastatin (CRESTOR) 10 MG tablet Take 10 mg by mouth daily.      . traMADol (ULTRAM) 50 MG tablet Take 50 mg by mouth 2 (two) times daily as needed for pain.       Marland Kitchen UNABLE TO FIND Apply 1 application topically once. Med Name: cranial prosthesis  1 application  0  . verapamil (CALAN-SR) 180 MG CR tablet Take 180 mg by mouth 2 (two) times daily.      . vitamin B-12 (CYANOCOBALAMIN) 1000 MCG tablet Take 1,000 mcg by mouth daily.      . vitamin C (ASCORBIC ACID) 500 MG tablet Take 500 mg by mouth daily.      Marland Kitchen warfarin (COUMADIN) 7.5 MG tablet Take 3.75-7.5 mg by mouth daily. 1 tab daily except 0.5 tab on Tues and Thurs      . folic acid (FOLVITE) 1 MG tablet Take 1 mg by mouth daily.       No current facility-administered medications for this encounter.   Facility-Administered Medications Ordered in Other Encounters  Medication Dose Route Frequency Provider Last Rate Last Dose  . sodium chloride 0.9 % injection 10 mL  10 mL Intracatheter PRN Monica December, MD        REVIEW OF SYSTEMS:  A 15 point review of systems is documented in the electronic medical record. This was obtained by the nursing staff. However, I reviewed this with the patient to discuss relevant findings and make appropriate changes. She denies any vaginal bleeding. She denies any pelvic pain. She complains of paresthesias involving her hands and feet.   PHYSICAL EXAM:  height is 5\' 7"  (1.702 m) and weight is 250 lb 6.4 oz (113.581 kg). Her temperature is 98 F (36.7 C). Her blood pressure is 152/47 and her pulse is 65. Her oxygen saturation is 95%.  the pupils are equal round and reactive to light. The extraocular eye movements are intact. The tongue is midline. There is no secondary infection noted in the oral cavity or posterior pharynx. The neck supraclavicular and axillary areas are free of adenopathy. Examination of the lungs reveals them to be  clear. The heart has a regular rhythm and rate. The abdomen is soft and nontender with normal bowel sounds. The inguinal areas are free of adenopathy. On pelvic examination the external genitalia are unremarkable. A speculum exam shows a residual cervical os.   this is difficult to identify in light of the patient's body habitus. Examination of extremities reveals bilateral ulcers along the plantar aspects of the great toes. There is erythema to both toes but no obvious infection. She  does have some edema of ankle and foot areas. Peripheral pulses appear adequate. Motor strength is 5 out of 5 in the proximal and distal muscle groups of the upper and lower extremities.   LABORATORY DATA:  Lab Results  Component Value Date   WBC 9.0 07/17/2013   HGB 9.4* 07/17/2013   HCT 30.9* 07/17/2013   MCV 86.3 07/17/2013   PLT 290 07/17/2013   Lab Results  Component Value Date   NA 145 07/17/2013   K 3.7 07/17/2013   CO2 28 07/17/2013   Lab Results  Component Value Date   ALT 9 07/17/2013   AST 13 07/17/2013   ALKPHOS 71 07/17/2013   BILITOT 0.70 07/17/2013     RADIOGRAPHY: No results found.    IMPRESSION: Endometrial cancer found incidentally at the time of supracervical hysterectomy. Patient is ready to proceed with brachytherapy as part of her treatments.  PLAN: Patient will be taken to the operating room in the near future along with gynecologic oncology for anticipated placement of a tandem/ring system for high-dose rate radiation therapy.    ------------------------------------------------  -----------------------------------  Billie Lade, PhD, MD

## 2013-07-25 ENCOUNTER — Telehealth: Payer: Self-pay | Admitting: *Deleted

## 2013-07-25 NOTE — Telephone Encounter (Signed)
CALLED PATIENT'S DAUGHTER - LISA HART TO INFORM OF APPT. FOR 07-27-13, SPOKE WITH PATIENT'S DAUGHTER - LISA HART AND SHE IS AWARE OF THIS APPT.

## 2013-07-26 NOTE — Progress Notes (Signed)
GYN Location of Tumor / Histology: endometrium  03/15/13  Abdominal hysterectomy - endometrioid adenocarcinoma  Patient presented with abnormal bleeding for a number of months  Evaluation has included an endometrial biopsy and D&C neither of which revealed endometrial cancer. However it is noted in the patient's operative note that the [facility] specimen did not show any definitive endometrial epithelium. Ultimately, because of heavy bleeding, the patient underwent surgery on 03/15/2013. Procedure was a laparoscopic supracervical hysterectomy which required morcellation to remove the uterus. Final pathology revealed a 225 g specimen which was morcellated. It is the pathologist opinion that the aggregate tumor size is approximately 6 cm. Currently depth of invasion could not be determined the tubes and ovaries appeared not to be involved with cancer.   Past/Anticipated interventions by medical oncology, if any: status post Cycle 2 of Taxol /carboplatinum adjuvantly, cycle 3 held due to bilateral foot infections. Radiation to follow then 3 more cycles of chemotherapy.  Weight changes, if any: None noted  Bowel/Bladder complaints, if any:  Nausea/Vomiting, if any: None noted  Pain issues, if any: None noted   SAFETY ISSUES:  Prior radiation? NO  Pacemaker/ICD? NO  Possible current pregnancy? NO  Is the patient on methotrexate? NO  Current Complaints / other details: 77 year old female. Legally seperated  Anticipated placement of tandem/ ring system for high dose rate radiation therapy. Pt denies pain, bladder/bowel issues, vaginal discharge, fatigue, loss of appetite. Process for today reviewed w/pt and daughter, Misty Stanley. All questions answered.

## 2013-07-26 NOTE — Progress Notes (Signed)
[facility] Psychosocial Distress Screening Clinical Social Work  Clinical Social Work was referred by distress screening protocol.  The patient scored a 7 on the Psychosocial Distress Thermometer which indicates moderate distress. Clinical Social Worker Intern telephoned to assess for distress and other psychosocial needs. In response to was there anything SW could help with Patient stated "no, not right now".  Patient stated that she will start chemo tomorrow and understands the procedure can last "1 to 12 hours".  Patient stated that several people were coming with her and that she might suggest they speak with SW while waiting.  CSWI encouraged Patient to come to SW area if she or anyone needed to talk and also informed Patient that CSWI was available to come to chemo if that was more convenient.  Patient thanked CSWI for calling and stated she would contact SW if anything further was needed.   Clinical Social Worker follow up needed: no  If yes, follow up plan:   Keyly Baldonado S. [facility] Clinical Social Work Intern [employer] [facility] [phone]

## 2013-07-27 ENCOUNTER — Encounter: Payer: Self-pay | Admitting: Radiation Oncology

## 2013-07-27 ENCOUNTER — Other Ambulatory Visit: Payer: Self-pay | Admitting: Radiation Oncology

## 2013-07-27 ENCOUNTER — Ambulatory Visit: Admission: RE | Admit: 2013-07-27 | Payer: Medicare Other | Source: Ambulatory Visit | Admitting: Radiation Oncology

## 2013-07-27 ENCOUNTER — Ambulatory Visit
Admission: RE | Admit: 2013-07-27 | Discharge: 2013-07-27 | Disposition: A | Discharge status: Home / Self Care | Payer: Medicare Other | Source: Ambulatory Visit | Attending: Radiation Oncology | Admitting: Radiation Oncology

## 2013-07-27 ENCOUNTER — Ambulatory Visit: Payer: Medicare Other | Admitting: Radiation Oncology

## 2013-07-27 VITALS — BP 117/70 | HR 68 | Temp 98.5°F | Resp 20 | Wt 254.0 lb

## 2013-07-27 DIAGNOSIS — C549 Malignant neoplasm of corpus uteri, unspecified: Secondary | ICD-10-CM | POA: Insufficient documentation

## 2013-07-27 DIAGNOSIS — C541 Malignant neoplasm of endometrium: Secondary | ICD-10-CM

## 2013-07-27 DIAGNOSIS — Z90711 Acquired absence of uterus with remaining cervical stump: Secondary | ICD-10-CM | POA: Insufficient documentation

## 2013-07-27 DIAGNOSIS — Z510 Encounter for antineoplastic radiation therapy: Secondary | ICD-10-CM | POA: Insufficient documentation

## 2013-07-27 NOTE — Progress Notes (Signed)
  Radiation Oncology         [phone] ________________________________  Name: Monica Compton MRN: [id number]  Date: 07/27/2013  DOB: 01/08/1936  SIMULATION AND TREATMENT PLANNING NOTE  DIAGNOSIS:  Endometrial cancer  NARRATIVE:  The patient was brought to the CT Simulation planning suite.  Identity was confirmed.  All relevant records and images related to the planned course of therapy were reviewed.  The patient freely provided informed written consent to proceed with treatment after reviewing the details related to the planned course of therapy. The consent form was witnessed and verified by the simulation staff.  Then, the patient was set-up in a stable reproducible  supine position for radiation therapy.  CT images were obtained.  the vaginal cylinder was noted to be in good position within the vaginal vault. A residual cervix was noted to be located along the anterior proximal vagina. Based on length of the residual cervix a vaginal cylinder prescription would not cover this adequately.  The vaginal cylinder was subsequently removed. The situation was discussed with the patient and her daughter. We also reviewed the films showing how a vaginal cylinder would not adequately cover the cervical region.  PLAN:  The patient will proceed to the operating room on November 18 with the assistance of gynecologic oncology to have placement of a short tandem within the cervix. A ring will also be attached to the tandem.  If technically possible the patient will have a Schmitt sleeve sewn in the cervical area so that repeated trips to the operating room can be avoided for the second, third, fourth and fifth treatment.                ________________________________     ------------------------------------------------------------------------------------------------------------------------------ 

## 2013-07-27 NOTE — Progress Notes (Signed)
Please see the Nurse Progress Note in the MD Initial Consult Encounter for this patient. 

## 2013-07-27 NOTE — Addendum Note (Signed)
Encounter addended by: Billie Lade, MD on: 07/27/2013  7:10 PM<BR>     Documentation filed: Visit Diagnoses, Follow-up Section, Notes Section

## 2013-07-27 NOTE — Progress Notes (Signed)
   Department of Radiation Oncology  Phone:  [phone] Fax:        [phone]  Vaginal brachytherapy procedure note   This was taken to the nursing suite and placed in the dorsolithotomy position. A speculum exam was performed showing the cervical os located along the anterior proximal vagina. There was some  mucus noted at the cervical os.  The patient proceeded to undergo fitting for her vaginal cylinder.  . The optimal diameter for treatment will be a 3.5 cm diameter cylinder.  The patient will be transferred to the CT simulation suite for imaging purposes and planning.  -----------------------------------  Billie Lade, PhD, MD

## 2013-08-02 ENCOUNTER — Other Ambulatory Visit: Payer: Self-pay | Admitting: Radiation Oncology

## 2013-08-02 ENCOUNTER — Encounter (HOSPITAL_COMMUNITY): Payer: Self-pay | Admitting: Pharmacy Technician

## 2013-08-02 DIAGNOSIS — C541 Malignant neoplasm of endometrium: Secondary | ICD-10-CM

## 2013-08-04 ENCOUNTER — Encounter (HOSPITAL_COMMUNITY): Payer: Self-pay

## 2013-08-04 ENCOUNTER — Other Ambulatory Visit: Payer: Self-pay | Admitting: Gynecologic Oncology

## 2013-08-04 ENCOUNTER — Encounter (HOSPITAL_COMMUNITY)
Admission: RE | Admit: 2013-08-04 | Discharge: 2013-08-04 | Disposition: A | Discharge status: Home / Self Care | Payer: Medicare Other | Source: Ambulatory Visit | Attending: Gynecologic Oncology | Admitting: Gynecologic Oncology

## 2013-08-04 DIAGNOSIS — C541 Malignant neoplasm of endometrium: Secondary | ICD-10-CM

## 2013-08-04 DIAGNOSIS — Z01812 Encounter for preprocedural laboratory examination: Secondary | ICD-10-CM | POA: Insufficient documentation

## 2013-08-04 HISTORY — DX: Shortness of breath: R06.02

## 2013-08-04 HISTORY — DX: Other complications of anesthesia, initial encounter: T88.59XA

## 2013-08-04 HISTORY — DX: Personal history of urinary calculi: Z87.442

## 2013-08-04 HISTORY — DX: Sleep apnea, unspecified: G47.30

## 2013-08-04 HISTORY — DX: Nocturia: R35.1

## 2013-08-04 HISTORY — DX: Presence of other vascular implants and grafts: Z95.828

## 2013-08-04 HISTORY — DX: Adverse effect of unspecified anesthetic, initial encounter: T41.45XA

## 2013-08-04 LAB — APTT: aPTT: 42 seconds — ABNORMAL HIGH (ref 24–37)

## 2013-08-04 LAB — CBC WITH DIFFERENTIAL/PLATELET
Basophils Relative: 0 % (ref 0–1)
Eosinophils Absolute: 0.1 10*3/uL (ref 0.0–0.7)
HCT: 35.5 % — ABNORMAL LOW (ref 36.0–46.0)
Lymphocytes Relative: 24 % (ref 12–46)
Lymphs Abs: 2.6 10*3/uL (ref 0.7–4.0)
MCH: 26.8 pg (ref 26.0–34.0)
Monocytes Absolute: 0.6 10*3/uL (ref 0.1–1.0)
Neutro Abs: 7.5 10*3/uL (ref 1.7–7.7)
Neutrophils Relative %: 69 % (ref 43–77)
Platelets: 278 10*3/uL (ref 150–400)
RBC: 4.07 MIL/uL (ref 3.87–5.11)
WBC: 10.8 10*3/uL — ABNORMAL HIGH (ref 4.0–10.5)

## 2013-08-04 LAB — BASIC METABOLIC PANEL
CO2: 32 mEq/L (ref 19–32)
Calcium: 9.4 mg/dL (ref 8.4–10.5)
GFR calc Af Amer: 74 mL/min — ABNORMAL LOW (ref >90)
GFR calc non Af Amer: 64 mL/min — ABNORMAL LOW (ref >90)
Glucose, Bld: 185 mg/dL — ABNORMAL HIGH (ref 70–99)
Potassium: 3.5 mEq/L (ref 3.5–5.1)
Sodium: 143 mEq/L (ref 135–145)

## 2013-08-04 LAB — PROTIME-INR
INR: 1.97 — ABNORMAL HIGH (ref 0.00–1.49)
Prothrombin Time: 21.8 seconds — ABNORMAL HIGH (ref 11.6–15.2)

## 2013-08-04 MED ORDER — ENOXAPARIN SODIUM 120 MG/0.8ML ~~LOC~~ SOLN: 1.0000 mg/kg | Freq: Two times a day (BID) | SUBCUTANEOUS | Status: DC

## 2013-08-04 NOTE — Patient Instructions (Addendum)
Monica Compton  08/04/2013                           YOUR PROCEDURE IS SCHEDULED ON: 08/08/13               PLEASE REPORT TO SHORT STAY CENTER AT : 5:15 AM               CALL THIS NUMBER IF ANY PROBLEMS THE DAY OF SURGERY :               [phone]                      REMEMBER:   Do not eat food or drink liquids AFTER MIDNIGHT   Take these medicines the morning of surgery with A SIP OF WATER:  GABAPENTIN / CRESTOR / TRAMADOL / VERAPAMIL / TAKE 1/2 DOSE OF LANTUS INSULIN (20 UNITS) THE NIGHT BEFORE SURGERY AND NONE ON THE MORNING OF SURGERY   / NO HUMALOG INSULIN THE MORNING OF SURGERY   Do not wear jewelry, make-up   Do not wear lotions, powders, or perfumes.   Do not shave legs or underarms 12 hrs. before surgery (men may shave face)  Do not bring valuables to the hospital.  Contacts, dentures or bridgework may not be worn into surgery.  Leave suitcase in the car. After surgery it may be brought to your room.  For patients admitted to the hospital more than one night, checkout time is 11:00                          The day of discharge.   Patients discharged the day of surgery will not be allowed to drive home                             If going home same day of surgery, must have someone stay with you first                           24 hrs at home and arrange for some one to drive you home from hospital.    Special Instructions:   Please read over the following fact sheets that you were given:               1.BRING C -PAP MASK AND TUBING TO HOSPITAL                    2. [place] PREPARING FOR SURGERY SHEET                                                X_____________________________________________________________________        Failure to follow these instructions may result in cancellation of your surgery

## 2013-08-07 ENCOUNTER — Encounter (HOSPITAL_COMMUNITY): Payer: Self-pay | Admitting: Anesthesiology

## 2013-08-07 ENCOUNTER — Telehealth: Payer: Self-pay | Admitting: Gynecologic Oncology

## 2013-08-07 NOTE — Pre-Procedure Instructions (Signed)
08-04-13 Records received from [facility] and placed with chart.W. Kennon Portela

## 2013-08-07 NOTE — Telephone Encounter (Signed)
Spoke with the patient on Friday, Nov 14. at 18:00 about the NP for Dr. Emilia Beck and Dr. Denman George recommendation to hold coumadin and begin lovenox bridging.  Dr. Duard Brady recommended lovenox 1 mg/kg BID with no injection to be given on Monday evening and Tues am for atrial fib.  Pt verbalizing understanding on Friday evening.  08/07/13 at 10:50am: Telephone call to check on pre-operative status.  Patient complaint with pre-operative instructions.  Reinforced NPO after midnight.  No questions or concerns voiced.  Instructed to call for any needs.  Has been taking her lovenox injections without difficulty.  Verbalizing understanding that her last dose of lovenox before her procedure is this am.  No concerns voiced.  Instructed to call for needs or concerns.

## 2013-08-07 NOTE — Anesthesia Preprocedure Evaluation (Addendum)
Anesthesia Evaluation  Patient identified by MRN, date of birth, ID band Patient awake    Reviewed: Allergy & Precautions, H&P , NPO status , Patient's Chart, lab work & pertinent test results  History of Anesthesia Complications (+) history of anesthetic complications  Airway Mallampati: II TM Distance: >3 FB Neck ROM: Full    Dental  (+) Dental Advisory Given and Loose She states preoperatively that her lower front teeth are loose. (Late entry):   Pulmonary shortness of breath, sleep apnea ,  breath sounds clear to auscultation  Pulmonary exam normal       Cardiovascular hypertension, Pt. on medications +CHF + dysrhythmias Atrial Fibrillation Rhythm:Regular Rate:Normal     Neuro/Psych negative neurological ROS  negative psych ROS   GI/Hepatic negative GI ROS, Neg liver ROS,   Endo/Other  diabetes, Type 1, Insulin Dependent  Renal/GU Renal disease  negative genitourinary   Musculoskeletal negative musculoskeletal ROS (+)   Abdominal (+) + obese,   Peds negative pediatric ROS (+)  Hematology negative hematology ROS (+)   Anesthesia Other Findings   Reproductive/Obstetrics negative OB ROS                         Anesthesia Physical Anesthesia Plan  ASA: III  Anesthesia Plan: General   Post-op Pain Management:    Induction: Intravenous  Airway Management Planned: LMA  Additional Equipment:   Intra-op Plan:   Post-operative Plan: Extubation in OR  Informed Consent: I have reviewed the patients History and Physical, chart, labs and discussed the procedure including the risks, benefits and alternatives for the proposed anesthesia with the patient or authorized representative who has indicated his/her understanding and acceptance.   Dental advisory given  Plan Discussed with: CRNA  Anesthesia Plan Comments: (Did OK with LMA #25 May 2012 for hysteroscopy at [facility].)        Anesthesia Quick Evaluation

## 2013-08-08 ENCOUNTER — Ambulatory Visit (HOSPITAL_COMMUNITY): Payer: Medicare Other | Admitting: Anesthesiology

## 2013-08-08 ENCOUNTER — Encounter (HOSPITAL_COMMUNITY)
Admission: RE | Disposition: A | Discharge status: Home / Self Care | Payer: Self-pay | Source: Ambulatory Visit | Attending: Gynecologic Oncology

## 2013-08-08 ENCOUNTER — Ambulatory Visit (HOSPITAL_COMMUNITY)
Admission: RE | Admit: 2013-08-08 | Discharge: 2013-08-08 | Disposition: A | Discharge status: Home / Self Care | Payer: Medicare Other | Source: Ambulatory Visit | Attending: Gynecologic Oncology | Admitting: Gynecologic Oncology

## 2013-08-08 ENCOUNTER — Ambulatory Visit (HOSPITAL_COMMUNITY)
Admission: RE | Admit: 2013-08-08 | Discharge: 2013-08-08 | Disposition: A | Discharge status: Home / Self Care | Payer: Medicare Other | Source: Ambulatory Visit | Attending: Radiation Oncology | Admitting: Radiation Oncology

## 2013-08-08 ENCOUNTER — Ambulatory Visit: Payer: Medicare Other | Admitting: Radiation Oncology

## 2013-08-08 ENCOUNTER — Telehealth: Payer: Self-pay | Admitting: *Deleted

## 2013-08-08 ENCOUNTER — Encounter (HOSPITAL_COMMUNITY): Payer: Medicare Other | Admitting: Anesthesiology

## 2013-08-08 ENCOUNTER — Ambulatory Visit
Admission: RE | Admit: 2013-08-08 | Discharge: 2013-08-08 | Disposition: A | Discharge status: Home / Self Care | Payer: Medicare Other | Source: Ambulatory Visit | Attending: Radiation Oncology | Admitting: Radiation Oncology

## 2013-08-08 ENCOUNTER — Encounter (HOSPITAL_COMMUNITY): Payer: Self-pay | Admitting: *Deleted

## 2013-08-08 ENCOUNTER — Other Ambulatory Visit (HOSPITAL_COMMUNITY): Payer: Medicare Other

## 2013-08-08 VITALS — BP 117/61 | HR 64 | Temp 98.0°F

## 2013-08-08 DIAGNOSIS — C541 Malignant neoplasm of endometrium: Secondary | ICD-10-CM

## 2013-08-08 DIAGNOSIS — I4891 Unspecified atrial fibrillation: Secondary | ICD-10-CM | POA: Insufficient documentation

## 2013-08-08 DIAGNOSIS — I10 Essential (primary) hypertension: Secondary | ICD-10-CM | POA: Insufficient documentation

## 2013-08-08 DIAGNOSIS — Z794 Long term (current) use of insulin: Secondary | ICD-10-CM | POA: Insufficient documentation

## 2013-08-08 DIAGNOSIS — C549 Malignant neoplasm of corpus uteri, unspecified: Secondary | ICD-10-CM | POA: Insufficient documentation

## 2013-08-08 DIAGNOSIS — G4730 Sleep apnea, unspecified: Secondary | ICD-10-CM | POA: Insufficient documentation

## 2013-08-08 DIAGNOSIS — E109 Type 1 diabetes mellitus without complications: Secondary | ICD-10-CM | POA: Insufficient documentation

## 2013-08-08 DIAGNOSIS — I509 Heart failure, unspecified: Secondary | ICD-10-CM | POA: Insufficient documentation

## 2013-08-08 HISTORY — PX: EXAMINATION UNDER ANESTHESIA: SHX1540

## 2013-08-08 LAB — GLUCOSE, CAPILLARY

## 2013-08-08 LAB — PROTIME-INR: INR: 1.27 (ref 0.00–1.49)

## 2013-08-08 SURGERY — EXAM UNDER ANESTHESIA
Anesthesia: General | Site: Vagina | Wound class: Clean Contaminated

## 2013-08-08 MED ORDER — PROMETHAZINE HCL 25 MG/ML IJ SOLN: 6.2500 mg | INTRAMUSCULAR | Status: DC | PRN

## 2013-08-08 MED ORDER — ATROPINE SULFATE 0.1 MG/ML IJ SOLN
INTRAMUSCULAR | Status: AC
  Filled 2013-08-08: qty 10

## 2013-08-08 MED ORDER — EPHEDRINE SULFATE 50 MG/ML IJ SOLN
INTRAMUSCULAR | Status: AC
  Filled 2013-08-08: qty 1

## 2013-08-08 MED ORDER — KETOROLAC TROMETHAMINE 30 MG/ML IJ SOLN
INTRAMUSCULAR | Status: AC
  Filled 2013-08-08: qty 1

## 2013-08-08 MED ORDER — PHENYLEPHRINE 40 MCG/ML (10ML) SYRINGE FOR IV PUSH (FOR BLOOD PRESSURE SUPPORT)
PREFILLED_SYRINGE | INTRAVENOUS | Status: AC
  Filled 2013-08-08: qty 10

## 2013-08-08 MED ORDER — LACTATED RINGERS IV SOLN
INTRAVENOUS | Status: DC | PRN
  Administered 2013-08-08: 07:00:00 via INTRAVENOUS

## 2013-08-08 MED ORDER — GLYCOPYRROLATE 0.2 MG/ML IJ SOLN
INTRAMUSCULAR | Status: AC
  Filled 2013-08-08: qty 1

## 2013-08-08 MED ORDER — MIDAZOLAM HCL 2 MG/2ML IJ SOLN
INTRAMUSCULAR | Status: AC
  Filled 2013-08-08: qty 2

## 2013-08-08 MED ORDER — DEXAMETHASONE SODIUM PHOSPHATE 10 MG/ML IJ SOLN
INTRAMUSCULAR | Status: AC
  Filled 2013-08-08: qty 1

## 2013-08-08 MED ORDER — LIDOCAINE HCL (CARDIAC) 20 MG/ML IV SOLN
INTRAVENOUS | Status: AC
  Filled 2013-08-08: qty 5

## 2013-08-08 MED ORDER — ROCURONIUM BROMIDE 100 MG/10ML IV SOLN
INTRAVENOUS | Status: AC
  Filled 2013-08-08: qty 1

## 2013-08-08 MED ORDER — FENTANYL CITRATE 0.05 MG/ML IJ SOLN
INTRAMUSCULAR | Status: DC | PRN
  Administered 2013-08-08: 50 ug via INTRAVENOUS

## 2013-08-08 MED ORDER — PROPOFOL 10 MG/ML IV BOLUS
INTRAVENOUS | Status: DC | PRN
  Administered 2013-08-08: 180 mg via INTRAVENOUS

## 2013-08-08 MED ORDER — ONDANSETRON HCL 4 MG/2ML IJ SOLN
INTRAMUSCULAR | Status: DC | PRN
  Administered 2013-08-08: 4 mg via INTRAVENOUS

## 2013-08-08 MED ORDER — ATROPINE SULFATE 0.4 MG/ML IJ SOLN
INTRAMUSCULAR | Status: AC
  Filled 2013-08-08: qty 1

## 2013-08-08 MED ORDER — SUCCINYLCHOLINE CHLORIDE 20 MG/ML IJ SOLN
INTRAMUSCULAR | Status: AC
  Filled 2013-08-08: qty 1

## 2013-08-08 MED ORDER — FENTANYL CITRATE 0.05 MG/ML IJ SOLN
INTRAMUSCULAR | Status: AC
  Filled 2013-08-08: qty 5

## 2013-08-08 MED ORDER — PROPOFOL 10 MG/ML IV BOLUS
INTRAVENOUS | Status: AC
  Filled 2013-08-08: qty 20

## 2013-08-08 MED ORDER — ONDANSETRON HCL 4 MG/2ML IJ SOLN
INTRAMUSCULAR | Status: AC
  Filled 2013-08-08: qty 2

## 2013-08-08 MED ORDER — LACTATED RINGERS IV SOLN
INTRAVENOUS | Status: DC
  Administered 2013-08-08: 11:00:00 via INTRAVENOUS
  Filled 2013-08-08: qty 250

## 2013-08-08 MED ORDER — ESTRADIOL 0.1 MG/GM VA CREA
TOPICAL_CREAM | VAGINAL | Status: DC | PRN
  Administered 2013-08-08: 1 via VAGINAL

## 2013-08-08 MED ORDER — LIDOCAINE HCL (CARDIAC) 10 MG/ML IV SOLN
INTRAVENOUS | Status: DC | PRN
  Administered 2013-08-08: 50 mg via INTRAVENOUS

## 2013-08-08 MED ORDER — ESTRADIOL 0.1 MG/GM VA CREA
TOPICAL_CREAM | VAGINAL | Status: AC
  Filled 2013-08-08: qty 42.5

## 2013-08-08 MED ORDER — FENTANYL CITRATE 0.05 MG/ML IJ SOLN: 25.0000 ug | INTRAMUSCULAR | Status: DC | PRN

## 2013-08-08 SURGICAL SUPPLY — 33 items
BAG URINE DRAINAGE (UROLOGICAL SUPPLIES) ×1 IMPLANT
BLADE HEX COATED 2.75 (ELECTRODE) ×1 IMPLANT
BLADE SURG 15 STRL LF DISP TIS (BLADE) ×1 IMPLANT
BLADE SURG 15 STRL SS (BLADE)
CATH FOLEY 2WAY SLVR  5CC 14FR (CATHETERS) ×1
CATH FOLEY 2WAY SLVR 5CC 14FR (CATHETERS) IMPLANT
DECANTER SPIKE VIAL GLASS SM (MISCELLANEOUS) ×1 IMPLANT
DRAIN PENROSE 18X1/2 LTX STRL (DRAIN) ×1 IMPLANT
DRAPE TABLE BACK 44X90 PK DISP (DRAPES) ×2 IMPLANT
DRESSING TELFA 8X3 (GAUZE/BANDAGES/DRESSINGS) ×1 IMPLANT
DRSG PAD ABDOMINAL 8X10 ST (GAUZE/BANDAGES/DRESSINGS) ×2 IMPLANT
ELECT REM PT RETURN 9FT ADLT (ELECTROSURGICAL)
ELECTRODE REM PT RTRN 9FT ADLT (ELECTROSURGICAL) ×1 IMPLANT
GLOVE BIO SURGEON STRL SZ 6.5 (GLOVE) ×2 IMPLANT
GLOVE BIOGEL PI IND STRL 7.0 (GLOVE) ×1 IMPLANT
GLOVE BIOGEL PI INDICATOR 7.0 (GLOVE) ×2
KIT BASIN OR (CUSTOM PROCEDURE TRAY) ×2 IMPLANT
LUBRICANT JELLY K Y 4OZ (MISCELLANEOUS) ×2 IMPLANT
NDL HYPO 25X1 1.5 SAFETY (NEEDLE) ×1 IMPLANT
NDL SAFETY ECLIPSE 18X1.5 (NEEDLE) ×1 IMPLANT
NEEDLE HYPO 18GX1.5 SHARP (NEEDLE)
NEEDLE HYPO 25X1 1.5 SAFETY (NEEDLE) IMPLANT
NS IRRIG 1000ML POUR BTL (IV SOLUTION) ×2 IMPLANT
PACK LITHOTOMY IV (CUSTOM PROCEDURE TRAY) ×2 IMPLANT
PACKING VAGINAL (PACKING) ×1 IMPLANT
PENCIL BUTTON HOLSTER BLD 10FT (ELECTRODE) ×1 IMPLANT
SPONGE GAUZE 4X4 12PLY (GAUZE/BANDAGES/DRESSINGS) ×1 IMPLANT
SPONGE SURGIFOAM ABS GEL 12-7 (HEMOSTASIS) ×1 IMPLANT
SUT VIC AB 0 CT1 27 (SUTURE) ×6
SUT VIC AB 0 CT1 27XBRD ANTBC (SUTURE) IMPLANT
SYR CONTROL 10ML LL (SYRINGE) ×2 IMPLANT
UNDERPAD 30X30 INCONTINENT (UNDERPADS AND DIAPERS) ×2 IMPLANT
YANKAUER SUCT BULB TIP 10FT TU (MISCELLANEOUS) ×1 IMPLANT

## 2013-08-08 NOTE — Progress Notes (Signed)
Patient's right hand IV removed intact.  Pressure and bandaid applied.  Assisted Dr. Roselind Messier with Tandem and Ring removal.  Removed foley catheter intact.  Assisted patient with getting dressed.  Went over discharge paperwork.  Brought patient up to the waiting room accompanied by her daughter in a wheelchair.  Advised her to call with any questions or concerns.

## 2013-08-08 NOTE — Progress Notes (Signed)
Monica Compton is eating lunch.  She denies pain.  Call light in reach.

## 2013-08-08 NOTE — Progress Notes (Signed)
POST-ANESTHESIA   IMMEDIATELY FOLLOWING SURGERY: Do not drive or operate machinery for the first twenty four hours after surgery. Do not make any important decisions for twenty four hours after surgery or while taking narcotic pain medications or sedatives. If you develop intractable nausea and vomiting or a severe headache please notify your doctor immediately.   FOLLOW-UP: You do not need to follow up with anesthesia unless specifically instructed to do so.   WOUND CARE INSTRUCTIONS (if applicable): Expect some mild vaginal bleeding, but if large amount of bleeding occurs please contact Dr. Kinard at [phone] or the Radiation On-Call physician. Call for any fever greater than 101.0 degrees or increasing vaginal//abdominal pain or trouble urinating.   QUESTIONS?: Please feel free to call your physician or the hospital operator if you have any questions, and they will be happy to assist you. Resume all medications: as listed on your after visit summary.     

## 2013-08-08 NOTE — Progress Notes (Signed)
   Department of Radiation Oncology  Phone:  [phone] Fax:        [phone]  High-dose-rate brachytherapy procedure note   Vaginal brachytherapy procedure  Earlier today the patient was taken to the operating room room along with Dr. Duard Brady.  the patient underwent exam under anesthesia followed by dilation of the cervical os.   the patient then proceeded to have a Schmidt sleeve placed in the cervix. This was sewn in place by Dr. Duard Brady. The patient tolerated the procedure well. By placing the Schmidt sleeve the patient can proceed with additional treatments as an outpatient without placement of the tandem/ring in the operating room.  The patient did have vaginal packing soaked in Estrace cream placed within the vaginal vault to secure the device and to aid in distancing  the rectum from the high dose radiation area.   Simple treatment device note  In operating room the patient had development of her custom brachytherapy treatment device. The optimal tandem for her anatomy was to place a 20 mm tandem (30 orientation). A 26 mm ring was attached to the tandem followed by a rectal paddle.  This was the optimal arrangement for the patient's anatomy.  Verification simulation note  After planning was complete the patient was transferred to the high dose rate suite. A fiducial marker was placed within the tandem and ring apparatus. The patient then had an AP and lateral film obtained in the treatment position. This verified accurate position of the tandem and ring compared to her planning films earlier in the day.  High-dose-rate brachytherapy treatment  The patient proceeded to have her first high-dose-rate treatment. The remote afterloading catheter was attached to the ring tandem system. The patient was treated for 565.1 seconds. 2 channels were used to deliver the treatments. She had 2 dwell positions in the tandem and 7 dwell positions within the ring system. The patient tolerated the  procedure well. After completion of her treatment a radiation survey was performed documenting return of the iridium source into the Nucletron safe. The patient will return next week for her second high-dose-rate treatment.  -----------------------------------  Billie Lade, PhD, MD

## 2013-08-08 NOTE — Telephone Encounter (Signed)
Called patient's daughter to inform of HDR Tandem Placement, for 08-15-13, 08-22-13, 08-29-13 and 09-05-13, spoke with patient's daughter Judie Petit and she is aware of these appts.

## 2013-08-08 NOTE — OR Nursing (Signed)
smit sleeve vaginal insertion. #[id number]  Lot# [id number]  Exp04-30-2016

## 2013-08-08 NOTE — Progress Notes (Signed)
Received report from [place], [place] in PACU.  Helped Scott, transporter to bring Ms. Boike to CT SIM.

## 2013-08-08 NOTE — Progress Notes (Signed)
Pt has both of her big toe wrapped with gauze and paper tape stating she has chronic wounds and they are known and not new.  No drainage or odor  noted.

## 2013-08-08 NOTE — H&P (View-Only) (Signed)
  Radiation Oncology         [phone] ________________________________  Name: Monica Compton MRN: [id number]  Date: 07/27/2013  DOB: 1936/03/18  SIMULATION AND TREATMENT PLANNING NOTE  DIAGNOSIS:  Endometrial cancer  NARRATIVE:  The patient was brought to the CT Simulation planning suite.  Identity was confirmed.  All relevant records and images related to the planned course of therapy were reviewed.  The patient freely provided informed written consent to proceed with treatment after reviewing the details related to the planned course of therapy. The consent form was witnessed and verified by the simulation staff.  Then, the patient was set-up in a stable reproducible  supine position for radiation therapy.  CT images were obtained.  the vaginal cylinder was noted to be in good position within the vaginal vault. A residual cervix was noted to be located along the anterior proximal vagina. Based on length of the residual cervix a vaginal cylinder prescription would not cover this adequately.  The vaginal cylinder was subsequently removed. The situation was discussed with the patient and her daughter. We also reviewed the films showing how a vaginal cylinder would not adequately cover the cervical region.  PLAN:  The patient will proceed to the operating room on November 18 with the assistance of gynecologic oncology to have placement of a short tandem within the cervix. A ring will also be attached to the tandem.  If technically possible the patient will have a Schmitt sleeve sewn in the cervical area so that repeated trips to the operating room can be avoided for the second, third, fourth and fifth treatment.                ________________________________     ------------------------------------------------------------------------------------------------------------------------------

## 2013-08-08 NOTE — Op Note (Signed)
PATIENT: Monica Compton DATE OF BIRTH: 1935-11-02 ENCOUNTER DATE: 08/08/2013   Preop Diagnosis: Endometrial cancer  Postoperative Diagnosis: Same  Surgery: Exam under anesthesia, Smit sleeve placement, Tandem and ovoid ring placement  Surgeons:  Rejeana Brock A. Duard Brady, MD; Antony Blackbird, MD   Anesthesia: General   Anesthesiologist: Payton Doughty  Estimated blood loss: Minimal ml  IVF: 600  ml   Urine output: 100  ml   Complications: None   Pathology: None  Operative findings: Long vagina with very anteverted cervix. Cervix was without lesions. Cervix was 4 cm in length.  Procedure: The patient was identified in the preoperative holding area. Informed consent was signed on the chart. Patient was seen history was reviewed and exam was performed.   Patient was taken to the operating room and placed in the supine position. LMA General anesthesia was induced. She was then placed in lithotomy position. The perineum was prepped with Betadine as was the vagina. Foley catheter was inserted into the bladder under sterile conditions. The patient was then draped. Timeout was performed to confirm the patient, procedure, allergy, antibiotic status. All equipment was in the room. A sterile speculum was placed into the vagina. The cervix was grasped with a single-tooth tenaculum. There were no visible lesions on the cervix. The cervix was dilated to 24. Stay sutures at 3 and 9:00 were placed with 0 Vicryl on a CT 1. The Smit sleeve was placed without difficulty and secured with 0 Vicryl sutures.  A 20 mm tandem at a 30 angle and a 26 mm ring were inserted. This placement was done by Dr. Roselind Messier. A vaginal pack with Premarin cream was inserted posteriorly to the blade between the blade and the rectum.  The patient tolerated the procedure well and was taken recovery in stable condition.   All instrument, suture, laparotomy, Ray-Tec, and needle counts were correct x2. The patient tolerated the procedure well  and was taken recovery room in stable condition. This is [name] dictating an operative note on [employer].

## 2013-08-08 NOTE — Progress Notes (Signed)
Patient ate everything on her lunch tray except the green beans.  Her call light in reach.  Will continue to monitor.

## 2013-08-08 NOTE — Progress Notes (Signed)
Ms. Bandel back from CT SIM.  She has LR infusing into her right hand IV at 50 ml/hr.  She has SCD's on.  Her foley is patent and draining clear, yellow urine.  She denies pain.  Call light in reach.  Patient asked for ice water.  A cup of ice water was brought to her.  Will continue to monitor.

## 2013-08-08 NOTE — Progress Notes (Signed)
   Department of Radiation Oncology  Phone:  [phone] Fax:        [phone]  Simulation note  The patient was brought down from the operating room after appropriate recovery period.  she was placed on the CT simulator table. Her foley catheter was accessed and contrast was placed in the bladder. She also had a fiducial markers placed within the tandem/ring system. The patient then proceeded to undergo a CT scan through the pelvis region. The tandem/ring was noted to be in accurate position. The Schmidt sleeve was noted to be in good position along the cervix region. Patient then had contouring of the residual cervix area, bladder, rectum, small bowel. She will undergo planning for her high-dose-rate brachy- therapy treatments.  -----------------------------------  Billie Lade, PhD, MD

## 2013-08-08 NOTE — Anesthesia Postprocedure Evaluation (Signed)
  Anesthesia Post-op Note  Patient: Monica Compton  Procedure(s) Performed: Procedure(s) (LRB):  exam under anesthesia with placementof a tandem ring for high dose radiation (N/A)  Patient Location: PACU  Anesthesia Type: General  Level of Consciousness: awake and alert   Airway and Oxygen Therapy: Patient Spontanous Breathing  Post-op Pain: mild  Post-op Assessment: Post-op Vital signs reviewed, Patient's Cardiovascular Status Stable, Respiratory Function Stable, Patent Airway and No signs of Nausea or vomiting  Last Vitals:  Filed Vitals:   08/08/13 0915  BP: 157/70  Pulse: 64  Temp: 36.7 C  Resp: 17    Post-op Vital Signs: stable   Complications: No apparent anesthesia complications

## 2013-08-08 NOTE — Interval H&P Note (Signed)
History and Physical Interval Note:  08/08/2013 7:08 AM  Parks Ranger  has presented today for surgery, with the diagnosis of ENDOMETRIAL CANCER  The various methods of treatment have been discussed with the patient and family. After consideration of risks, benefits and other options for treatment, the patient has consented to  Procedure(s): EXAM UNDER ANESTHESIA (N/A) as a surgical intervention .  The patient's history has been reviewed, patient examined, no change in status, stable for surgery.  I have reviewed the patient's chart and labs.  Questions were answered to the patient's satisfaction.    INR normal this morning. She is willing to proceed with surgery and we may speak with her family.  Barry Faircloth A.

## 2013-08-08 NOTE — Transfer of Care (Signed)
Immediate Anesthesia Transfer of Care Note  Patient: Monica Compton  Procedure(s) Performed: Procedure(s):  exam under anesthesia with placementof a tandem ring for high dose radiation (N/A)  Patient Location: PACU  Anesthesia Type:General  Level of Consciousness: awake, alert , oriented and patient cooperative  Airway & Oxygen Therapy: Patient Spontanous Breathing and Patient connected to face mask oxygen  Post-op Assessment: Report given to PACU RN, Post -op Vital signs reviewed and stable and Patient moving all extremities X 4  Post vital signs: stable  Complications: No apparent anesthesia complications

## 2013-08-09 ENCOUNTER — Encounter (HOSPITAL_COMMUNITY): Payer: Self-pay | Admitting: Gynecologic Oncology

## 2013-08-10 ENCOUNTER — Telehealth: Payer: Self-pay | Admitting: *Deleted

## 2013-08-10 NOTE — Telephone Encounter (Signed)
CALLED PATIENT'S DAUGHTER, LISA HART AND ASKED HER TO ARRIVE AT 8 AM ON 08-15-13 , PATIENT'S DAUGHTER SAID THAT WAS O.K.

## 2013-08-14 ENCOUNTER — Telehealth: Payer: Self-pay | Admitting: *Deleted

## 2013-08-14 ENCOUNTER — Telehealth: Payer: Self-pay | Admitting: Oncology

## 2013-08-14 NOTE — Telephone Encounter (Signed)
CALLED PATIENT TO REMIND TO ARRIVE AT 8 AM ON 08-15-13, LVM FOR A RETURN CALL

## 2013-08-14 NOTE — Telephone Encounter (Signed)
Called and spoke to [place].  Advised her to take her tramadol an hour before she comes in for her tandem and ring tomorrow per Dr. Roselind Messier.  Suzanna said she would take 1 tablet before coming in.

## 2013-08-15 ENCOUNTER — Ambulatory Visit
Admit: 2013-08-15 | Discharge: 2013-08-15 | Disposition: A | Discharge status: Home / Self Care | Payer: Medicare Other | Attending: Radiation Oncology | Admitting: Radiation Oncology

## 2013-08-15 ENCOUNTER — Ambulatory Visit
Admission: RE | Admit: 2013-08-15 | Discharge: 2013-08-15 | Disposition: A | Discharge status: Home / Self Care | Payer: Medicare Other | Source: Ambulatory Visit | Attending: Radiation Oncology | Admitting: Radiation Oncology

## 2013-08-15 DIAGNOSIS — C541 Malignant neoplasm of endometrium: Secondary | ICD-10-CM

## 2013-08-15 DIAGNOSIS — C801 Malignant (primary) neoplasm, unspecified: Secondary | ICD-10-CM

## 2013-08-15 NOTE — Progress Notes (Signed)
Checked on patient.  She denies having pain.  Call light in reach.  Will continue to monitor.

## 2013-08-15 NOTE — Progress Notes (Signed)
Applied SCD's.  Patient resting comfortably.

## 2013-08-15 NOTE — Progress Notes (Addendum)
Assisted Dr. Roselind Messier with inserting tandem and ring equipment.  Patient tolerated procedure well without any pain.  She was brought to CT SIM by [name] and Monica Compton from CT [facility].

## 2013-08-15 NOTE — Progress Notes (Signed)
WOUND CARE INSTRUCTIONS (if applicable): Expect some mild vaginal bleeding, but if large amount of bleeding occurs please contact Dr. Roselind Messier at [phone] or the Radiation On-Call physician. Call for any fever greater than 101.0 degrees or increasing vaginal//abdominal pain or trouble urinating.  QUESTIONS?: Please feel free to call your physician or the hospital operator if you have any questions, and they will be happy to assist you.  Resume all medications: as listed on your after visit summary.  Your next appointment is on 08/22/2013 at 08:00 am.

## 2013-08-15 NOTE — Progress Notes (Signed)
Assisted Dr. Roselind Messier with removal of tandem and ring equipment.  Removed foley catheter intact.  Gave patient discharge instructions.  She left the clinic in a wheelchair with her sister.

## 2013-08-15 NOTE — Progress Notes (Signed)
Patient back from CT SIM.  She is resting comfortably without pain on the stretcher.  Her family is with her.  She is drinking a sprite zero.  Call light in reach.  Will continue to monitor.

## 2013-08-15 NOTE — Progress Notes (Signed)
Monica Compton here with her sister and daughter.  Foley catheter inserted by Sheldon Silvan, RN.  Foley is patent and draining cloudy yellow urine.

## 2013-08-15 NOTE — Progress Notes (Signed)
   Department of Radiation Oncology  Phone: [phone]  Fax: [phone]   High-dose-rate brachytherapy procedure note   Vaginal brachytherapy procedure   The patient was taken to the nursing suite and placed on the departmental operating table. Patient under sterile conditions had a Foley catheter placed without difficulty. Patient then underwent examination. The Schmidt sleeve remained in place at the cervical os. OR sutures were also noted. Patient had Betadine placed along the cervical os region. She then proceeded to have placement of a 40 mm maximum curvature tandem within the residual cervix area to the patient then had a 30 mm vaginal ring placed in the proximal vagina. This was attached to the tandem. Patient then had a 30 rectal paddle attached to the tandem/ring system. The patient tolerated the procedure well.    Simple treatment device note    the patient had development of her custom brachytherapy treatment device. The optimal tandem for her anatomy was to place a 40 mm tandem (30 orientation). A 30 mm ring was attached to the tandem followed by a rectal paddle. This was the optimal arrangement for the patient's anatomy.   Simulation note  After placement of the high-dose rate equipment. The patient was transferred to the CT simulation suite. The patient's bladder catheter was accessed and contrast was placed within the bladder. She then proceeded to undergo orthogonal images (AP and lateral). Patient then proceeded to undergo CT scan through the pelvis area. The tandem/ring/paddle system appear to be in good position for treatment. The patient will proceed with planning for her second high-dose-rate treatment.   Verification simulation note   After planning was complete the patient was transferred to the high dose rate suite. A fiducial marker was placed within the tandem and ring apparatus. The patient then had an AP and lateral film obtained in the treatment position.  This verified accurate position of the tandem and ring compared to her planning films earlier in the day.   High-dose-rate brachytherapy treatment   The patient proceeded to have her second high-dose-rate treatment. The remote afterloading catheter was attached to the ring tandem system. The patient was treated for 480.1 seconds. 2 channels were used to deliver the treatments. She had 5 dwell positions in the tandem and 9 dwell positions within the ring system. The patient tolerated the procedure well. After completion of her treatment a radiation survey was performed documenting return of the iridium source into the Nucletron safe. The patient will return next week for her third high-dose-rate treatment.  -----------------------------------   Billie Lade, PhD, MD

## 2013-08-21 ENCOUNTER — Telehealth: Payer: Self-pay | Admitting: *Deleted

## 2013-08-21 NOTE — Telephone Encounter (Signed)
Called patient's daughter, Judie Petit to remind to bring her mom at 8 am on 08-22-13, lvm for a return call

## 2013-08-22 ENCOUNTER — Ambulatory Visit
Admit: 2013-08-22 | Discharge: 2013-08-22 | Disposition: A | Discharge status: Home / Self Care | Payer: Medicare Other | Attending: Radiation Oncology | Admitting: Radiation Oncology

## 2013-08-22 ENCOUNTER — Ambulatory Visit
Admission: RE | Admit: 2013-08-22 | Discharge: 2013-08-22 | Disposition: A | Discharge status: Home / Self Care | Payer: Medicare Other | Source: Ambulatory Visit | Attending: Radiation Oncology | Admitting: Radiation Oncology

## 2013-08-22 DIAGNOSIS — C541 Malignant neoplasm of endometrium: Secondary | ICD-10-CM

## 2013-08-22 NOTE — Progress Notes (Signed)
Assisted Dr. Roselind Messier with tandem and ring removal.  Patient tolerated procedure well.  Removed her foley catheter intact.  Went over discharge instructions including to call with pain, trouble urinating, vaginal bleeding or fever over 101 degrees.   She was escorted out of the clinic in a wheelchair by Lorin Picket, Nurse Transporter and her daughter.

## 2013-08-22 NOTE — Progress Notes (Signed)
  Department of Radiation Oncology  Phone: [phone]  Fax: [phone]   High-dose-rate brachytherapy procedure note    Vaginal brachytherapy procedure   The patient was taken to the nursing suite and placed on the departmental operating table. PAS dose were placed. Patient under sterile conditions had a Foley catheter placed without difficulty. Patient then underwent examination. The Schmidt sleeve remained in place at the cervical os. OR sutures were also noted. Patient had Betadine placed along the cervical os region. She then proceeded to have placement of a 40 mm maximum curvature tandem within the residual cervix area to the patient then had a 30 mm vaginal ring placed in the proximal vagina. This was attached to the tandem. Patient then had a 30 rectal paddle attached to the tandem/ring system. The patient tolerated the procedure well.   Simple treatment device note   the patient had development of her custom brachytherapy treatment device. The optimal tandem for her anatomy was to place a 40 mm tandem (30 orientation). A 30 mm ring was attached to the tandem followed by a rectal paddle. This was the optimal arrangement for the patient's anatomy.   Simulation note   After placement of the high-dose rate equipment. The patient was transferred to the CT simulation suite. The patient's bladder catheter was accessed and contrast was placed within the bladder. She then proceeded to undergo orthogonal images (AP and lateral). Patient then proceeded to undergo CT scan through the pelvis area. The tandem/ring/paddle system appear to be in good position for treatment. The patient will proceed with planning for her second high-dose-rate treatment.   Verification simulation note   After planning was complete the patient was transferred to the high dose rate suite. A fiducial marker was placed within the tandem and ring apparatus. The patient then had an AP and lateral film obtained in the  treatment position. This verified accurate position of the tandem and ring compared to her planning films earlier in the day.   High-dose-rate brachytherapy treatment   The patient proceeded to have her third high-dose-rate treatment. The remote afterloading catheter was attached to the ring tandem system. The patient was treated for 723.3 seconds. 2 channels were used to deliver the treatments. She had 4 dwell positions in the tandem and 10 dwell positions within the ring system. The patient tolerated the procedure well. After completion of her treatment a radiation survey was performed documenting return of the iridium source into the Nucletron safe. The patient will return next week for her third high-dose-rate treatment.  -----------------------------------   Billie Lade, PhD, MD

## 2013-08-22 NOTE — Progress Notes (Signed)
WOUND CARE INSTRUCTIONS (if applicable): Expect some mild vaginal bleeding, but if large amount of bleeding occurs please contact Dr. Roselind Messier at [phone] or the Radiation On-Call physician. Call for any fever greater than 101.0 degrees or increasing vaginal//abdominal pain or trouble urinating.   QUESTIONS?: Please feel free to call your physician or the hospital operator if you have any questions, and they will be happy to assist you.  Resume all medications: as listed on your after visit summary. Your next appointment is on 08/29/2013 at 08:00 am.

## 2013-08-22 NOTE — Progress Notes (Signed)
Aliza here with her daughter for tandem ring treatment.  Foley catheter inserted by Liborio Nixon, RN.  Foley is patent and draining cloudy, yellow urine.  Call light in reach.

## 2013-08-22 NOTE — Progress Notes (Addendum)
Patient back from CT SIM.  She is resting comfortably on the stretcher.  She denies pain.  She was given sprite zero and the call light is in reach.  SCD's on.  Will continue to monitor.

## 2013-08-22 NOTE — Progress Notes (Signed)
Assisted Dr. Roselind Messier with placement of tandem and ovid equipment.  Patient tolerated well.  She was taken to CT SIM.

## 2013-08-28 ENCOUNTER — Telehealth: Payer: Self-pay | Admitting: *Deleted

## 2013-08-28 NOTE — Telephone Encounter (Signed)
Called patient to remind of HDR Tx. For 08-29-13, lvm for a return call

## 2013-08-29 ENCOUNTER — Encounter: Payer: Self-pay | Admitting: Radiation Oncology

## 2013-08-29 ENCOUNTER — Ambulatory Visit: Admission: RE | Admit: 2013-08-29 | Payer: Medicare Other | Source: Ambulatory Visit | Admitting: Radiation Oncology

## 2013-08-29 ENCOUNTER — Ambulatory Visit
Admission: RE | Admit: 2013-08-29 | Discharge: 2013-08-29 | Disposition: A | Discharge status: Home / Self Care | Payer: Medicare Other | Source: Ambulatory Visit | Attending: Radiation Oncology | Admitting: Radiation Oncology

## 2013-08-29 ENCOUNTER — Ambulatory Visit (HOSPITAL_COMMUNITY)
Admission: RE | Admit: 2013-08-29 | Discharge: 2013-08-29 | Disposition: A | Discharge status: Home / Self Care | Payer: Medicare Other | Source: Ambulatory Visit | Attending: Radiation Oncology | Admitting: Radiation Oncology

## 2013-08-29 ENCOUNTER — Ambulatory Visit
Admit: 2013-08-29 | Discharge: 2013-08-29 | Disposition: A | Discharge status: Home / Self Care | Payer: Medicare Other | Attending: Radiation Oncology | Admitting: Radiation Oncology

## 2013-08-29 VITALS — BP 117/51 | HR 62 | Temp 98.1°F

## 2013-08-29 DIAGNOSIS — R300 Dysuria: Secondary | ICD-10-CM | POA: Insufficient documentation

## 2013-08-29 DIAGNOSIS — R059 Cough, unspecified: Secondary | ICD-10-CM | POA: Insufficient documentation

## 2013-08-29 DIAGNOSIS — C541 Malignant neoplasm of endometrium: Secondary | ICD-10-CM

## 2013-08-29 DIAGNOSIS — R05 Cough: Secondary | ICD-10-CM | POA: Insufficient documentation

## 2013-08-29 DIAGNOSIS — R0602 Shortness of breath: Secondary | ICD-10-CM | POA: Insufficient documentation

## 2013-08-29 DIAGNOSIS — R062 Wheezing: Secondary | ICD-10-CM | POA: Insufficient documentation

## 2013-08-29 DIAGNOSIS — R918 Other nonspecific abnormal finding of lung field: Secondary | ICD-10-CM | POA: Insufficient documentation

## 2013-08-29 DIAGNOSIS — C549 Malignant neoplasm of corpus uteri, unspecified: Secondary | ICD-10-CM | POA: Insufficient documentation

## 2013-08-29 LAB — CBC WITH DIFFERENTIAL/PLATELET
BASO%: 1 % (ref 0.0–2.0)
Basophils Absolute: 0.1 10*3/uL (ref 0.0–0.1)
Eosinophils Absolute: 0.1 10*3/uL (ref 0.0–0.5)
HGB: 11.2 g/dL — ABNORMAL LOW (ref 11.6–15.9)
LYMPH%: 28.2 % (ref 14.0–49.7)
MCHC: 32.2 g/dL (ref 31.5–36.0)
MCV: 84.9 fL (ref 79.5–101.0)
MONO#: 0.9 10*3/uL (ref 0.1–0.9)
MONO%: 9.4 % (ref 0.0–14.0)
NEUT#: 5.6 10*3/uL (ref 1.5–6.5)
RDW: 20.2 % — ABNORMAL HIGH (ref 11.2–14.5)
WBC: 9.4 10*3/uL (ref 3.9–10.3)
lymph#: 2.7 10*3/uL (ref 0.9–3.3)

## 2013-08-29 LAB — URINALYSIS, MICROSCOPIC - CHCC
Bilirubin (Urine): NEGATIVE
Glucose: NEGATIVE mg/dL
Ketones: NEGATIVE mg/dL
Specific Gravity, Urine: 1.005 (ref 1.003–1.035)
Urobilinogen, UR: 0.2 mg/dL (ref 0.2–1)
pH: 7.5 (ref 4.6–8.0)

## 2013-08-29 NOTE — Progress Notes (Addendum)
Obtained sterile urine sample from Monica Compton's foley catheter.  Removed foley catheter intact.  Helped Monica Compton to get dressed.  She reports she is breathing without difficulty.  Monica Compton, Nurse Transporter escorted her in a wheelchair with her daughter to radiology for a chest x-ray.

## 2013-08-29 NOTE — Progress Notes (Signed)
Ramani coughing when laying flat on the table.  Placed on 2l of oxygen per nasal canula.  Assisted Dr. Roselind Messier with trying to place tandem equipment.  Dr. Roselind Messier decided to cancel the treatment due to Ms. Cavenaugh not being able to lay flat without coughing.

## 2013-08-29 NOTE — Progress Notes (Signed)
At [phone] met patient and daughter lisa hart at the elevator, ,asked daughter to go in waiting room while we take patient to dressing room for HDR treatment, , while walking with patient down the hall heard a loud thud turned around and 8 feet back saw that patient;'s daughter had fallen in between the door of the waiting room, on her hands and knees, assisted patient up and she stated"My ankle turned and I fell on my right knee", assisted patent to the chair asked if she was all right,,Kasren Christell Constant, receptionist in waiting room looked up and saw that patient was on the floor,  went and got an ice pack and elevated her leg on another chair,placed ice pack over her right  Knee, asked her if she needed to be seen by an MD or go to the ED,daughter stated"No, I'm fine just a weak knee",   0820 amwent and informed Dr.kinard who stated she should go to the ED to have checked out" went back to patient and she still declined to go to the ED, .notified Sonda Rumble, RN supervisor when she came in at 9am,  .

## 2013-08-29 NOTE — Progress Notes (Signed)
   Department of Radiation Oncology  Phone:  [phone] Fax:        [phone]  Progress note  The patient presented today for her fourth high-dose-rate treatment. Patient developed a cough over the past 24 hours. This was particularly bothersome when she was lying flat. Attempted placement of the tandem was performed however given the patient's coughing I was concerned that she may push the implant device out with increased abdominal pressure.  The planned the HDR procedure was postponed until next week.  Prior to having the patient's Foley catheter removed she did have a cath UA performed to rule out potential bladder infection.  The patient also went for a chest x-ray today in light of her extensive coughing. On physical examination she was noted to have a lot of wheezing present.  She may require antibiotic treatment depending on results of her chest x-ray and urinalysis/culture.  -----------------------------------  Billie Lade, PhD, MD

## 2013-08-29 NOTE — Progress Notes (Addendum)
Monica Compton here for tandem ring treatment.  A foley catheter was inserted via sterile technique on the first attempt.  It is patent and draining clear, yellow urine.  She reported that she felt like she had the flu over the weekend with sweating.  She has a productive, frequent cough.  She is wheezing.  Notified Dr. Roselind Messier.

## 2013-08-30 ENCOUNTER — Telehealth: Payer: Self-pay | Admitting: Oncology

## 2013-08-30 ENCOUNTER — Other Ambulatory Visit: Payer: Self-pay | Admitting: Oncology

## 2013-08-30 NOTE — Telephone Encounter (Signed)
Misty Stanley, Virgilia's daughter, called and said that her mother has recently used diflucan and also bactrim DS back in July for a UTI.  She uses [facility].

## 2013-08-31 ENCOUNTER — Telehealth: Payer: Self-pay | Admitting: Oncology

## 2013-08-31 ENCOUNTER — Other Ambulatory Visit: Payer: Self-pay | Admitting: Radiation Oncology

## 2013-08-31 NOTE — Telephone Encounter (Signed)
Misty Stanley called again insisting that an antibiotic be called in for [place].  Notified Dr. Roselind Messier.  Advised Misty Stanley that Keflex will be called in.  Called in Keflex 500 mg po bid for 10 days.  Dispense 20. 0 refills to [facility].

## 2013-08-31 NOTE — Telephone Encounter (Signed)
Misty Stanley called again regarding a prescription being called in for her mother.  Advised her that Dr. Roselind Messier is working on it.

## 2013-08-31 NOTE — Telephone Encounter (Signed)
Misty Stanley called and was wondering if anything is going to be called in for her mother's UTI.  Notified Dr. Noreene Filbert.  Called [facility] to see if they had filled a script for Bactrim in the past.  They said they had filled it 04/01/12.  Called Bonita Quin and asked if she had any reactions to bactrim due to her allergy to sulfur.  Kierra said she did not have a reaction and in fact had one left and took it Tuesday night without issues.  Called Marleta back per Dr. Trina Ao request to see if she is still taking Coumadin.  She states that she is taking it daily.

## 2013-09-01 ENCOUNTER — Telehealth: Payer: Self-pay | Admitting: Oncology

## 2013-09-01 NOTE — Telephone Encounter (Signed)
Called Monica Compton to check to see if she is feeling better today.  She said she has takien 3 capsules of Keflex so far.  She said she is feeling better.  She is still hoarse but her cough is better.  She said her nose is still running.  She reports "that she is not sure if she will be able to have the procedure on Tuesday if she is still coughing."  She said her urinary frequency has improved a little bit.

## 2013-09-04 ENCOUNTER — Telehealth: Payer: Self-pay | Admitting: *Deleted

## 2013-09-04 NOTE — Telephone Encounter (Signed)
CALLED PATIENT TO REMIND OF APPT. FOR 09-05-13, LVM FOR A RETURN CALL

## 2013-09-05 ENCOUNTER — Ambulatory Visit: Payer: Medicare Other | Admitting: Radiation Oncology

## 2013-09-05 ENCOUNTER — Ambulatory Visit: Payer: Medicare Other

## 2013-09-05 ENCOUNTER — Telehealth: Payer: Self-pay | Admitting: *Deleted

## 2013-09-05 NOTE — Telephone Encounter (Signed)
CALLED PATIENT TO INFORM THAT HDR CASE HAS BEEN MOVED TO 09-12-13- ARRIVAL TIME - 8 AM, LVM FOR A RETURN CALL

## 2013-09-11 ENCOUNTER — Telehealth: Payer: Self-pay | Admitting: Oncology

## 2013-09-11 ENCOUNTER — Telehealth: Payer: Self-pay | Admitting: *Deleted

## 2013-09-11 NOTE — Telephone Encounter (Signed)
CALLED PATIENT'S DAUGHTER TO REMIND OF APPTS. FOR 09-12-13, SPOKE WITH LISA HART AND SHE CONFIRMED THESE APPTS. FOR 09-12-13

## 2013-09-11 NOTE — Telephone Encounter (Signed)
Calling Monica Compton to see if she is feeling better and if she is planing on coming tomorrow for her tandem and ovoid treatment.  Monica Compton said she is feeling much better.  She is still coughing occasionally.  She stated that her bladder infection symptoms have resolved.  Notified Dr. Roselind Messier.

## 2013-09-12 ENCOUNTER — Telehealth: Payer: Self-pay | Admitting: Oncology

## 2013-09-12 ENCOUNTER — Ambulatory Visit
Admission: RE | Admit: 2013-09-12 | Discharge: 2013-09-12 | Disposition: A | Discharge status: Home / Self Care | Payer: Medicare Other | Source: Ambulatory Visit | Attending: Radiation Oncology | Admitting: Radiation Oncology

## 2013-09-12 VITALS — BP 147/94 | HR 66 | Temp 97.9°F

## 2013-09-12 DIAGNOSIS — C541 Malignant neoplasm of endometrium: Secondary | ICD-10-CM

## 2013-09-12 NOTE — Progress Notes (Signed)
   Department of Radiation Oncology  Phone: [phone]  Fax: [phone]   High-dose-rate brachytherapy procedure note   Vaginal brachytherapy procedure   The patient was taken to the nursing suite and placed on the departmental operating table. PAS dose were placed. Patient under sterile conditions had a Foley catheter placed without difficulty. Patient then underwent examination. The Smit sleeve remained in place at the cervical os. OR sutures were also noted. Patient had Betadine placed along the cervical os region. She then proceeded to have placement of a 40 mm maximum curvature tandem within the residual cervix area to the patient then had a 30 mm vaginal ring placed in the proximal vagina. This was attached to the tandem. Patient then had a 30 rectal paddle attached to the tandem/ring system. The patient tolerated the procedure well.   Simple treatment device note   the patient had development of her custom brachytherapy treatment device. The optimal tandem for her anatomy was to place a 40 mm tandem (30 orientation). A 30 mm ring was attached to the tandem followed by a rectal paddle. This was the optimal arrangement for the patient's anatomy.   Simulation note   After placement of the high-dose rate equipment. The patient was transferred to the CT simulation suite. The patient's bladder catheter was accessed and contrast was placed within the bladder. She then proceeded to undergo orthogonal images (AP and lateral). Patient then proceeded to undergo CT scan through the pelvis area. The tandem/ring/paddle system appear to be in good position for treatment. The patient will proceed with planning for her second high-dose-rate treatment.   Verification simulation note   After planning was complete the patient was transferred to the high dose rate suite. A fiducial marker was placed within the tandem and ring apparatus. The patient then had an AP and lateral film obtained in the  treatment position. This verified accurate position of the tandem and ring compared to her planning films earlier in the day.   High-dose-rate brachytherapy treatment   The patient proceeded to have her fourth high-dose-rate treatment. The remote afterloading catheter was attached to the ring tandem system. The patient was treated for 250 seconds. 2 channels were used to deliver the treatments. She had 7 dwell positions in the tandem and 15 dwell positions within the ring system. The patient tolerated the procedure well. After completion of her treatment a radiation survey was performed documenting return of the iridium source into the Nucletron safe. The patient will return next week for her third high-dose-rate treatment. Please note that the source change occurred between the patient's third and fourth treatment and therefore treatment time was less for her fourth HDR treatment -----------------------------------   Billie Lade, PhD, MD

## 2013-09-12 NOTE — Progress Notes (Signed)
Monica Compton back from [facility].  Her daughter, Misty Stanley, is with her in exam 1.  SCD boots on.  Call light in reach.  Patient given a sprite zero per her request.  Will continue to monitor.

## 2013-09-12 NOTE — Progress Notes (Signed)
WOUND CARE INSTRUCTIONS (if applicable): Expect some mild vaginal bleeding, but if large amount of bleeding occurs please contact Dr. Kinard at [phone] or the Radiation On-Call physician. Call for any fever greater than 101.0 degrees or increasing vaginal//abdominal pain or trouble urinating.   QUESTIONS?: Please feel free to call your physician or the hospital operator if you have any questions, and they will be happy to assist you. Resume all medications: as listed on your after visit summary. .  

## 2013-09-12 NOTE — Progress Notes (Addendum)
Monica Compton here for tandem and ovoid treatment.  She states she is not coughing now.  Foley catheter insterted via sterile technique by Reginia Forts, RN.  Foley is draining pale, yellow urine.  Assisted Dr. Roselind Messier with tandem equipment.  Patient brought to CT SIM.

## 2013-09-12 NOTE — Progress Notes (Signed)
Patient states that the pain is gone in her right knee.  Will continue to monitor.

## 2013-09-12 NOTE — Progress Notes (Signed)
Dr. Roselind Messier was assisted by Colen Darling, RN to remove tandem equipment.  Patient tolerated well.  Monica Compton removed the foley catheter intact.  Discharge paperwork given to [facility].  She was brought up to the lobby in a wheelchair to wait for her daughter.

## 2013-09-12 NOTE — Progress Notes (Signed)
Patient having pain at 6/10 in her right knee.  Patient took her own tramadol from home - 50 mg tablet.  Dr. Roselind Messier OK with patient taking her own tramadol tablet.

## 2013-09-18 ENCOUNTER — Telehealth: Payer: Self-pay | Admitting: *Deleted

## 2013-09-18 NOTE — Telephone Encounter (Signed)
CALLED PATIENT'S DAUGHTER LISA HART TO INFORM HER THAT DR. KINARD HAD ME ARRANGE THIS PATIENT ONE MORE TREATMENT AND THAT WILL BE ON 09-27-13- ARRIVAL TIME - 8 AM, SPOKE WITH PATIENT'S DAUGHTER (LISA HART) AND SHE IS AWARE OF THIS

## 2013-09-26 ENCOUNTER — Telehealth: Payer: Self-pay | Admitting: *Deleted

## 2013-09-26 NOTE — Telephone Encounter (Signed)
Called patient's daughter, Gerda Diss to remind of HDR Case for 09-27-13- arrival time - 8 am, spoke with patient's daughter- Gerda Diss and she is aware of this appt.

## 2013-09-27 ENCOUNTER — Ambulatory Visit
Admission: RE | Admit: 2013-09-27 | Discharge: 2013-09-27 | Disposition: A | Discharge status: Home / Self Care | Payer: Medicare Other | Source: Ambulatory Visit | Attending: Radiation Oncology | Admitting: Radiation Oncology

## 2013-09-27 VITALS — BP 136/60 | HR 64 | Temp 97.4°F | Ht 67.5 in | Wt 242.7 lb

## 2013-09-27 DIAGNOSIS — C541 Malignant neoplasm of endometrium: Secondary | ICD-10-CM

## 2013-09-27 NOTE — Progress Notes (Signed)
WOUND CARE INSTRUCTIONS (if applicable): Expect some mild vaginal bleeding, but if large amount of bleeding occurs please contact Dr. Sondra Come at [phone] or the Radiation On-Call physician. Call for any fever greater than 101.0 degrees or increasing vaginal//abdominal pain or trouble urinating.   QUESTIONS?: Please feel free to call your physician or the hospital operator if you have any questions, and they will be happy to assist you. Resume all medications: as listed on your after visit summary. Marland Kitchen

## 2013-09-27 NOTE — Progress Notes (Signed)
Assisted Dr. Sondra Come with Tandem and ring insertion.  Patient tolerated well.  Call light in reach.  Notified CT [facility] that she is ready.

## 2013-09-27 NOTE — Progress Notes (Signed)
Foley catheter inserted.  Catheter is patent and draining clear yellow urine.  Patient tolerated well.

## 2013-09-27 NOTE — Progress Notes (Signed)
Assisted Dr. Sondra Come with removal of tandem and ovoid equipment.  Also helped with removal of smit sleeve.  Removed foley catheter intact.  Helped patient to get dressed.  Patient given dc paperwork.  Patient given follow up appointment card.   Escorted to the waiting room by [facility], Trans[facility].

## 2013-09-27 NOTE — Progress Notes (Signed)
  Department of Radiation Oncology  Phone: [phone]  Fax: [phone]   High-dose-rate brachytherapy procedure note   Vaginal brachytherapy procedure   The patient was taken to the nursing suite and placed on the departmental operating table. PAS dose were placed. Patient under sterile conditions had a Foley catheter placed without difficulty. Patient then underwent examination. The Smit sleeve remained in place at the cervical os. OR sutures were also noted. Patient had Betadine placed along the cervical os region. She then proceeded to have placement of a 40 mm maximum curvature tandem within the residual cervix area to the patient then had a 30 mm vaginal ring placed in the proximal vagina. This was attached to the tandem. Patient then had a 30 rectal paddle attached to the tandem/ring system. The patient tolerated the procedure well.   Simple treatment device note   the patient had development of her custom brachytherapy treatment device. The optimal tandem for her anatomy was to place a 40 mm tandem (30 orientation). A 30 mm ring was attached to the tandem followed by a rectal paddle. This was the optimal arrangement for the patient's anatomy.   Simulation note   After placement of the high-dose rate equipment. The patient was transferred to the [place]. The patient's bladder catheter was accessed and contrast was placed within the bladder. She then proceeded to undergo orthogonal images (AP and lateral). Patient then proceeded to undergo CT scan through the pelvis area. The tandem/ring/paddle system appear to be in good position for treatment. The patient will proceed with planning for her second high-dose-rate treatment.   Verification simulation note   After planning was complete the patient was transferred to the high dose rate suite. A fiducial marker was placed within the tandem and ring apparatus. The patient then had an AP and lateral film obtained in the  treatment position. This verified accurate position of the tandem and ring compared to her planning films earlier in the day.   High-dose-rate brachytherapy treatment   The patient proceeded to have her fifth high-dose-rate treatment. The remote afterloading catheter was attached to the ring tandem system. The patient was treated for 295.6 seconds. 2 channels were used to deliver the treatments. She had 7 dwell positions in the tandem and 13 dwell positions within the ring system. The patient tolerated the procedure well. After completion of her treatment a radiation survey was performed documenting return of the iridium source into the Nucletron safe. The patient was transferred to the nursing suite. The ring/tandem system was removed without difficulty. The smit sleeve which was placed for treatments was also removed without difficulty from the cervical. -----------------------------------   Blair Promise, PhD, MD

## 2013-09-27 NOTE — Progress Notes (Signed)
Patient took her own tramadol tablet from home.  Will notify Dr. Sondra Come.

## 2013-09-28 NOTE — Telephone Encounter (Signed)
error 

## 2013-10-01 ENCOUNTER — Encounter: Payer: Self-pay | Admitting: Oncology

## 2013-10-01 ENCOUNTER — Encounter: Payer: Self-pay | Admitting: Gynecology

## 2013-10-03 ENCOUNTER — Other Ambulatory Visit: Payer: Self-pay | Admitting: *Deleted

## 2013-10-03 DIAGNOSIS — C649 Malignant neoplasm of unspecified kidney, except renal pelvis: Secondary | ICD-10-CM

## 2013-10-03 DIAGNOSIS — C541 Malignant neoplasm of endometrium: Secondary | ICD-10-CM

## 2013-10-04 ENCOUNTER — Telehealth: Payer: Self-pay | Admitting: Oncology

## 2013-10-05 ENCOUNTER — Telehealth: Payer: Self-pay | Admitting: Oncology

## 2013-10-05 NOTE — Telephone Encounter (Signed)
, °

## 2013-10-06 ENCOUNTER — Encounter: Payer: Self-pay | Admitting: Gynecology

## 2013-10-06 ENCOUNTER — Ambulatory Visit: Payer: Medicare Other | Attending: Gynecology | Admitting: Gynecology

## 2013-10-06 VITALS — BP 157/71 | HR 76 | Temp 98.7°F | Resp 18 | Ht 65.5 in | Wt 242.6 lb

## 2013-10-06 DIAGNOSIS — Z87442 Personal history of urinary calculi: Secondary | ICD-10-CM | POA: Insufficient documentation

## 2013-10-06 DIAGNOSIS — Z90711 Acquired absence of uterus with remaining cervical stump: Secondary | ICD-10-CM | POA: Insufficient documentation

## 2013-10-06 DIAGNOSIS — G4730 Sleep apnea, unspecified: Secondary | ICD-10-CM | POA: Insufficient documentation

## 2013-10-06 DIAGNOSIS — Z96659 Presence of unspecified artificial knee joint: Secondary | ICD-10-CM | POA: Insufficient documentation

## 2013-10-06 DIAGNOSIS — Z79899 Other long term (current) drug therapy: Secondary | ICD-10-CM | POA: Insufficient documentation

## 2013-10-06 DIAGNOSIS — E669 Obesity, unspecified: Secondary | ICD-10-CM | POA: Insufficient documentation

## 2013-10-06 DIAGNOSIS — E119 Type 2 diabetes mellitus without complications: Secondary | ICD-10-CM | POA: Insufficient documentation

## 2013-10-06 DIAGNOSIS — Z794 Long term (current) use of insulin: Secondary | ICD-10-CM | POA: Insufficient documentation

## 2013-10-06 DIAGNOSIS — C549 Malignant neoplasm of corpus uteri, unspecified: Secondary | ICD-10-CM | POA: Insufficient documentation

## 2013-10-06 DIAGNOSIS — I4891 Unspecified atrial fibrillation: Secondary | ICD-10-CM | POA: Insufficient documentation

## 2013-10-06 DIAGNOSIS — C541 Malignant neoplasm of endometrium: Secondary | ICD-10-CM

## 2013-10-06 DIAGNOSIS — G609 Hereditary and idiopathic neuropathy, unspecified: Secondary | ICD-10-CM | POA: Insufficient documentation

## 2013-10-06 DIAGNOSIS — I10 Essential (primary) hypertension: Secondary | ICD-10-CM | POA: Insufficient documentation

## 2013-10-06 DIAGNOSIS — Z7901 Long term (current) use of anticoagulants: Secondary | ICD-10-CM | POA: Insufficient documentation

## 2013-10-06 NOTE — Progress Notes (Signed)
Consult Note: Gyn-Onc   Monica Compton 78 y.o. female  Chief Complaint  Patient presents with  . Endometrial Cancer    Assessment : Grade 1 endometrial cancer status post supracervical hysterectomy with morcellation.  The patient tolerated chemotherapy poorly and wishes to discontinue.  Plan: I explained the patient and her daughter that given the fact that the uterus was morcellated she is at high risk for intraperitoneal recurrence. Unfortunately we are unable to complete the planned 6 cycles of carboplatin and Taxol chemotherapy. Alternatively our options are to observe or to treat systemically with another agent. I believe she would not tolerate any other cytotoxic chemotherapy regimens. However, given the fact that this is a grade 1 malignancy, she may be responsive to Megace. Side effects and Megace were reviewed. The patient and her daughter in agreement to go ahead and begin taking Megace 40 mg 3 times a day. She returned to see me in 2 months.     HPI: 78 year old white female seen in consultation regarding management of a newly diagnosed endometrial cancer. Review of records show the patient had abnormal bleeding for a number of months. Evaluation has included an endometrial biopsy and D&C neither of which revealed endometrial cancer. However it is noted in the patient's operative note that the [facility] specimen did not show any definitive endometrial epithelium. Ultimately, because of heavy bleeding, the patient underwent surgery on 03/15/2013. Procedure was a laparoscopic supracervical hysterectomy which required morcellation to remove the uterus. Final pathology revealed a 225 g specimen which was morcellated. It is the pathologist opinion that the aggregate tumor size is approximately 6 cm. Currently depth of invasion could not be determined the tubes and ovaries appeared not to be involved with cancer.  The patient's postoperative course was uncomplicated.  We initiated adjuvant  therapy planning 6 cycles of carboplatin and Taxol. However the patient tolerated the chemotherapy very poorly and only received 2 cycles. She did have intracavitary brachytherapy of her cervical stump which went well..  Review of Systems:10 point review of systems is negative except as noted in interval history.   Vitals: Blood pressure 157/71, pulse 76, temperature 98.7 F (37.1 C), temperature source Oral, resp. rate 18, height 5' 5.5" (1.664 m), weight 242 lb 9.6 oz (110.043 kg).  Physical Exam: General : The patient is a healthy woman in no acute distress.  HEENT: normocephalic, extraoccular movements normal; neck is supple without thyromegally  Lynphnodes: Supraclavicular and inguinal nodes not enlarged  Abdomen: Soft, non-tender, no ascites, no organomegally, no masses, no hernias , the abdomen is obese and has a well-healed transverse incision in the epigastrium allegedly for a hernia repair. Pelvic:  EGBUS: Normal female  Vagina: Normal, no lesions  Urethra and Bladder: Normal, non-tender  Cervix: Difficult to visualize because the patient's habitus but seems to be normal. Uterus: Surgically absent  Bi-manual examination: Non-tender; no adenxal masses or nodularity  Rectal: normal sphincter tone, no masses, no blood  Lower extremities: No edema or varicosities. Normal range of motion      Allergies  Allergen Reactions  . Statins Itching  . Sulfur Hives  . Ciprofloxacin Hcl Rash    Past Medical History  Diagnosis Date  . Hypertension   . Diabetes mellitus without complication   . Arthritis   . A-fib   . Hx of migraines   . CHF (congestive heart failure)   . Neuropathy     FINGERS AND TOES  . Complication of anesthesia     PROLONGED EFFECT -  SHORT TERM MEMORY PROBLEMS  . Shortness of breath     WITH EXERTION  . Sleep apnea     USES C-PAP  . 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 Nocturia   . History of kidney stones   . Cancer 03/2013     Endometrial cancer / kidney cancer removed  . Port-a-cath in place     Past Surgical History  Procedure Laterality Date  . Tumor removal  2012    Kidney  . Replacement total knee bilateral      10 yrs ago, 29 yrs ago  . Ankle surgery Left   . Abdominal hysterectomy  03/15/2013  . Dilation and curettage of uterus  12/2012  . Hernia repair      x2  . Examination under anesthesia N/A 08/08/2013    Procedure:  exam under anesthesia with placementof a tandem ring for high dose radiation;  Surgeon: Imagene Gurney A. Alycia Rossetti, MD;  Location: [facility] ORS;  Service: Gynecology;  Laterality: N/A;    Current Outpatient Prescriptions  Medication Sig Dispense Refill  . Cholecalciferol (VITAMIN D-3) 1000 UNITS CAPS Take 1 capsule by mouth daily.       . digoxin (LANOXIN) 0.125 MG tablet Take 0.125 mg by mouth every Monday, Wednesday, and Friday.       . enoxaparin (LOVENOX) 120 MG/0.8ML injection Inject 0.75 mLs (115 mg total) into the skin every 12 (twelve) hours.  5 Syringe  3  . ferrous sulfate 325 (65 FE) MG tablet Take 325 mg by mouth daily with breakfast.      . fish oil-omega-3 fatty acids 1000 MG capsule Take 1 g by mouth 2 (two) times daily.       . folic acid (FOLVITE) 1 MG tablet Take 1 mg by mouth daily.      . furosemide (LASIX) 80 MG tablet Take 120 mg by mouth daily with lunch. Pt reports taking 2 pills daily      . gabapentin (NEURONTIN) 300 MG capsule Take 300 mg by mouth 3 (three) times daily.       . insulin glargine (LANTUS) 100 UNIT/ML injection Inject 40-51 Units into the skin 2 (two) times daily. 51u in am, 40u in pm      . insulin lispro (HUMALOG) 100 UNIT/ML injection Inject 8-9 Units into the skin 2 (two) times daily. 8u in am, 9u in pm      . potassium chloride SA (K-DUR,KLOR-CON) 20 MEQ tablet Take 20 mEq by mouth 2 (two) times daily.       . rosuvastatin (CRESTOR) 10 MG tablet Take 10 mg by mouth every morning.       . traMADol (ULTRAM) 50 MG tablet Take 50 mg by mouth 2 (two) times  daily as needed for pain.       . verapamil (CALAN-SR) 180 MG CR tablet Take 180 mg by mouth 2 (two) times daily.      . vitamin B-12 (CYANOCOBALAMIN) 1000 MCG tablet Take 1,000 mcg by mouth daily.      . vitamin C (ASCORBIC ACID) 500 MG tablet Take 500 mg by mouth daily.      Marland Kitchen warfarin (COUMADIN) 7.5 MG tablet Take 3.75-7.5 mg by mouth daily. 1 tab daily except 0.5 tab on Tues and Thurs       No current facility-administered medications for this visit.   Facility-Administered Medications Ordered in Other Visits  Medication Dose Route Frequency Provider Last Rate Last Dose  .  sodium chloride 0.9 % injection 10 mL  10 mL Intracatheter PRN Deatra Robinson, MD        History   Social History  . Marital Status: Legally Separated    Spouse Name: N/A    Number of Children: 4  . Years of Education: N/A   Occupational History  . Not on file.   Social History Main Topics  . Smoking status: Never Smoker   . Smokeless tobacco: Never Used  . Alcohol Use: No  . Drug Use: No  . Sexual Activity: No   Other Topics Concern  . Not on file   Social History Narrative  . No narrative on file    Family History  Problem Relation Age of Onset  . Diabetes Mother   . Stroke Mother   . Atrial fibrillation Mother   . Hypertension Mother   . Diabetes Maternal Grandmother   . Heart attack Father       Alvino Chapel, MD 10/06/2013, 3:46 PM

## 2013-10-06 NOTE — Patient Instructions (Signed)
Plan to follow up in two months or sooner if needed.  Begin taking Megace three times daily.  Please call for any questions or concerns.   Megestrol tablets What is this medicine? MEGESTROL (me JES trol) belongs to a class of drugs known as progestins. Megestrol tablets are used to treat advanced breast or endometrial cancer. This medicine may be used for other purposes; ask your health care provider or pharmacist if you have questions. COMMON BRAND NAME(S): Megace What should I tell my health care provider before I take this medicine? They need to know if you have any of these conditions: -adrenal gland problems -history of blood clots of the legs, lungs, or other parts of the body -diabetes -kidney disease -liver disease -stroke -an unusual or allergic reaction to megestrol, other medicines, foods, dyes, or preservatives -pregnant or trying to get pregnant -breast-feeding How should I use this medicine? Take this medicine by mouth. Follow the directions on the prescription label. Do not take your medicine more often than directed. Take your doses at regular intervals. Do not stop taking except on the advice of your doctor or health care professional. Talk to your pediatrician regarding the use of this medicine in children. Special care may be needed. Overdosage: If you think you have taken too much of this medicine contact a poison control center or emergency room at once. NOTE: This medicine is only for you. Do not share this medicine with others. What if I miss a dose? If you miss a dose, take it as soon as you can. If it is almost time for your next dose, take only that dose. Do not take double or extra doses. What may interact with this medicine? Do not take this medicine with any of the following medications: -dofetilide This medicine may also interact with the following medications: -carbamazepine -indinavir -phenobarbital -phenytoin -primidone -rifampin -warfarin This  list may not describe all possible interactions. Give your health care provider a list of all the medicines, herbs, non-prescription drugs, or dietary supplements you use. Also tell them if you smoke, drink alcohol, or use illegal drugs. Some items may interact with your medicine. What should I watch for while using this medicine? Visit your doctor or health care professional for regular checks on your progress. Continue taking this medicine even if you feel better. It may take 2 months of regular use before you know if this medicine is working for your condition. If you are a female of child-bearing age, use an effective method of birth control while you are taking this medicine. This medicine should not be used by females who are pregnant or breast-feeding. There is a potential for serious side effects to an unborn child or to an infant. Talk to your health care professional or pharmacist for more information. If you have diabetes, this medicine may affect blood sugar levels. Check your blood sugar and talk to your doctor or health care professional if you notice changes. What side effects may I notice from receiving this medicine? Side effects that you should report to your doctor or health care professional as soon as possible: -difficulty breathing or shortness of breath -chest pain -dizziness -fluid retention -increased blood pressure -leg pain or swelling -nausea and vomiting -skin rash or itching -weakness Side effects that usually do not require medical attention (report to your doctor or health care professional if they continue or are bothersome): -breakthrough menstrual bleeding -hot flashes or flushing -increased appetite -mood changes -sweating -weight gain This list may  not describe all possible side effects. Call your doctor for medical advice about side effects. You may report side effects to FDA at 1-800-FDA-1088. Where should I keep my medicine? Keep out of the reach of  children. Store at controlled room temperature between 15 and 30 degrees C (59 and 86 degrees F). Protect from heat above 40 degrees C (104 degrees F). Throw away any unused medicine after the expiration date. NOTE: This sheet is a summary. It may not cover all possible information. If you have questions about this medicine, talk to your doctor, pharmacist, or health care provider.  2014, Elsevier/Gold Standard. (2008-03-26 15:57:10)

## 2013-10-08 ENCOUNTER — Encounter: Payer: Self-pay | Admitting: Radiation Oncology

## 2013-10-08 NOTE — Progress Notes (Signed)
  Radiation Oncology         ([phone]) [phone] ________________________________  Name: Monica Compton MRN: [id number]  Date: 10/08/2013  DOB: 1936-04-26  End of Treatment Note  Diagnosis:     Grade 1 endometrial adenocarcinoma found unexpectedly at the time of supracervical hysterectomy for severe uterine bleeding  Indication for treatment:  Risk for recurrence in the cervical stump       Radiation treatment dates:   November 18,  November 25, December 2, December 23,  January 7  Site/dose:   Cervical stump 29.5 gray in 5 fractions, 5.5, 6.0, 6.0, 6.0, 6.0 Gy  Beams/energy:   The patient was treated with intracavitary brachytherapy treatments using iridium 192 as the high-dose-rate source.  the patient was treated with a ring tandem system and prescribed dose covered the residual volume.  the endocervical canal received a higher dose based on the brachytherapy plans.  Narrative: The patient tolerated radiation treatment relatively well.   She did require a break in treatment secondary to bronchitis after her third treatment.   Concern was high for the patient to push the high-dose-rate device out of position with coughing.  Plan: The patient has completed radiation treatment. The patient will return to radiation oncology clinic for routine followup in one month. I advised them to call or return sooner if they have any questions or concerns related to their recovery or treatment.  -----------------------------------  Blair Promise, PhD, MD

## 2013-10-09 ENCOUNTER — Ambulatory Visit: Payer: Medicare Other | Admitting: Oncology

## 2013-11-10 ENCOUNTER — Encounter: Payer: Self-pay | Admitting: Oncology

## 2013-11-13 ENCOUNTER — Telehealth: Payer: Self-pay | Admitting: Oncology

## 2013-11-13 ENCOUNTER — Encounter: Payer: Self-pay | Admitting: Radiation Oncology

## 2013-11-13 ENCOUNTER — Ambulatory Visit
Admission: RE | Admit: 2013-11-13 | Discharge: 2013-11-13 | Disposition: A | Discharge status: Home / Self Care | Payer: Medicare Other | Source: Ambulatory Visit | Attending: Radiation Oncology | Admitting: Radiation Oncology

## 2013-11-13 VITALS — BP 154/85 | HR 93 | Temp 97.7°F | Ht 65.5 in | Wt 246.3 lb

## 2013-11-13 DIAGNOSIS — C541 Malignant neoplasm of endometrium: Secondary | ICD-10-CM

## 2013-11-13 NOTE — Progress Notes (Signed)
Radiation Oncology         [phone] ________________________________  Name: Monica Compton MRN: [id number]  Date: 11/13/2013  DOB: [birth year]  Follow-Up Visit Note  CC: Robyn Haber, MD  Robyn Haber, MD  Diagnosis:   Grade 1 endometrial adenocarcinoma found unexpectedly at the time of supracervical hysterectomy for severe uterine bleeding   Interval Since Last Radiation:  6  weeks  Narrative:  The patient returns today for routine follow-up.  She denies any pain in the pelvis region vaginal bleeding or discharge. She denies any urinary or bowel complaints. She has noticed increased appetite related to her Megace and is inquiring about reducing the dosage on this medicine. I  Recommend she discuss this with gynecologic oncology. Patient recently completed course of antibiotics for infection along her foot area.                            ALLERGIES:  is allergic to statins; sulfur; and ciprofloxacin hcl.  Meds: Current Outpatient Prescriptions  Medication Sig Dispense Refill  . Cholecalciferol (VITAMIN D-3) 1000 UNITS CAPS Take 1 capsule by mouth daily.       . digoxin (LANOXIN) 0.125 MG tablet Take 0.125 mg by mouth every Monday, Wednesday, and Friday.       . ferrous sulfate 325 (65 FE) MG tablet Take 325 mg by mouth daily with breakfast.      . fish oil-omega-3 fatty acids 1000 MG capsule Take 1 g by mouth 2 (two) times daily.       . folic acid (FOLVITE) 1 MG tablet Take 1 mg by mouth daily.      . furosemide (LASIX) 80 MG tablet Take 120 mg by mouth daily with lunch. Pt reports taking 2 pills daily      . gabapentin (NEURONTIN) 300 MG capsule Take 300 mg by mouth 3 (three) times daily.       . insulin glargine (LANTUS) 100 UNIT/ML injection Inject 40-51 Units into the skin 2 (two) times daily. 51u in am, 40u in pm      . insulin lispro (HUMALOG) 100 UNIT/ML injection Inject 8-9 Units into the skin 2 (two) times daily. 8u in am, 9u in pm      . megestrol (MEGACE)  40 MG tablet Take 40 mg by mouth 3 (three) times daily.       . potassium chloride SA (K-DUR,KLOR-CON) 20 MEQ tablet Take 20 mEq by mouth 2 (two) times daily.       . rosuvastatin (CRESTOR) 10 MG tablet Take 10 mg by mouth every morning.       . traMADol (ULTRAM) 50 MG tablet Take 50 mg by mouth 2 (two) times daily as needed for pain.       . verapamil (CALAN-SR) 180 MG CR tablet Take 180 mg by mouth 2 (two) times daily.      . vitamin B-12 (CYANOCOBALAMIN) 1000 MCG tablet Take 1,000 mcg by mouth daily.      . vitamin C (ASCORBIC ACID) 500 MG tablet Take 500 mg by mouth daily.      Marland Kitchen warfarin (COUMADIN) 7.5 MG tablet Take 3.75-7.5 mg by mouth daily. 1 tab daily except 0.5 tab on Tues and Thurs      . enoxaparin (LOVENOX) 120 MG/0.8ML injection Inject 0.75 mLs (115 mg total) into the skin every 12 (twelve) hours.  5 Syringe  3   No current facility-administered medications for this encounter.  Facility-Administered Medications Ordered in Other Encounters  Medication Dose Route Frequency Provider Last Rate Last Dose  . sodium chloride 0.9 % injection 10 mL  10 mL Intracatheter PRN Deatra Robinson, MD        Physical Findings: The patient is in no acute distress. Patient is alert and oriented.  height is 5' 5.5" (1.664 m) and weight is 246 lb 4.8 oz (111.721 kg). Her temperature is 97.7 F (36.5 C). Her blood pressure is 154/85 and her pulse is 93. Her oxygen saturation is 94%. .  The lungs are clear. The heart has a regular rhythm and rate. The abdomen is soft and nontender with normal bowel sounds.  Pelvic exam and Pap smear as not performed in light of the patient's recent completion of treatment.  Lab Findings: Lab Results  Component Value Date   WBC 9.4 08/29/2013   HGB 11.2* 08/29/2013   HCT 34.8 08/29/2013   MCV 84.9 08/29/2013   PLT 213 08/29/2013      Radiographic Findings: No results found.  Impression:  The patient is recovering from the effects of radiation.    Plan:   Followup in 4-5 months patient will be seen by gynecologic oncology late next month. She will likely have a Pap smear that time. Today the patient was given a vaginal dilator and instructions on its use.  ____________________________________ Blair Promise, MD

## 2013-11-13 NOTE — Progress Notes (Addendum)
Monica Compton here for follow up after treatment for endometrial cancer.  She denies pain.  She is currently taking megace which has increased her appetite and she reports she is always thirsty.  She denies vaginal bleeding/discharge.  She reports urinary urgency but denies dysuria.  She denies diarrhea.  She denies fatigue.  She reports that both of her big toes are infected again and she was recently on antibiotics.  Tirza was given a vaginal dilator and surgilube.  She was instructed to use it every other day for 10 minutes and to clean it with soap and water.

## 2013-11-13 NOTE — Telephone Encounter (Addendum)
Per [name], NP, Monica Compton can reduce her does of Megace to 40 mg BID instead of TID due to the increase in her appetite.  Called [name] to let her know to reduce the does on the Megace to twice a day.

## 2013-11-14 NOTE — Addendum Note (Signed)
Encounter addended by: Jacqulyn Liner, RN on: 11/14/2013  5:48 PM<BR>     Documentation filed: Notes Section

## 2013-11-16 NOTE — Telephone Encounter (Signed)
Error

## 2013-12-11 ENCOUNTER — Ambulatory Visit: Payer: Medicare Other | Attending: Gynecology | Admitting: Gynecology

## 2013-12-11 ENCOUNTER — Encounter: Payer: Self-pay | Admitting: Gynecology

## 2013-12-11 ENCOUNTER — Other Ambulatory Visit (HOSPITAL_COMMUNITY)
Admission: RE | Admit: 2013-12-11 | Discharge: 2013-12-11 | Disposition: A | Discharge status: Home / Self Care | Payer: Medicare Other | Source: Ambulatory Visit | Attending: Gynecology | Admitting: Gynecology

## 2013-12-11 VITALS — BP 166/62 | HR 56 | Temp 98.7°F | Resp 16 | Ht 65.55 in | Wt 258.2 lb

## 2013-12-11 DIAGNOSIS — I4891 Unspecified atrial fibrillation: Secondary | ICD-10-CM | POA: Insufficient documentation

## 2013-12-11 DIAGNOSIS — Z794 Long term (current) use of insulin: Secondary | ICD-10-CM | POA: Insufficient documentation

## 2013-12-11 DIAGNOSIS — I10 Essential (primary) hypertension: Secondary | ICD-10-CM | POA: Insufficient documentation

## 2013-12-11 DIAGNOSIS — Z882 Allergy status to sulfonamides status: Secondary | ICD-10-CM | POA: Insufficient documentation

## 2013-12-11 DIAGNOSIS — Z7901 Long term (current) use of anticoagulants: Secondary | ICD-10-CM | POA: Insufficient documentation

## 2013-12-11 DIAGNOSIS — C541 Malignant neoplasm of endometrium: Secondary | ICD-10-CM

## 2013-12-11 DIAGNOSIS — Z79899 Other long term (current) drug therapy: Secondary | ICD-10-CM | POA: Insufficient documentation

## 2013-12-11 DIAGNOSIS — C549 Malignant neoplasm of corpus uteri, unspecified: Secondary | ICD-10-CM | POA: Insufficient documentation

## 2013-12-11 DIAGNOSIS — Z888 Allergy status to other drugs, medicaments and biological substances status: Secondary | ICD-10-CM | POA: Insufficient documentation

## 2013-12-11 DIAGNOSIS — E1142 Type 2 diabetes mellitus with diabetic polyneuropathy: Secondary | ICD-10-CM | POA: Insufficient documentation

## 2013-12-11 DIAGNOSIS — G4730 Sleep apnea, unspecified: Secondary | ICD-10-CM | POA: Insufficient documentation

## 2013-12-11 DIAGNOSIS — E1149 Type 2 diabetes mellitus with other diabetic neurological complication: Secondary | ICD-10-CM | POA: Insufficient documentation

## 2013-12-11 DIAGNOSIS — N3944 Nocturnal enuresis: Secondary | ICD-10-CM | POA: Insufficient documentation

## 2013-12-11 DIAGNOSIS — Z881 Allergy status to other antibiotic agents status: Secondary | ICD-10-CM | POA: Insufficient documentation

## 2013-12-11 DIAGNOSIS — Z124 Encounter for screening for malignant neoplasm of cervix: Secondary | ICD-10-CM | POA: Insufficient documentation

## 2013-12-11 DIAGNOSIS — Z90711 Acquired absence of uterus with remaining cervical stump: Secondary | ICD-10-CM | POA: Insufficient documentation

## 2013-12-11 NOTE — Progress Notes (Signed)
Consult Note: Gyn-Onc   Monica Compton 78 y.o. female  No chief complaint on file.   Assessment : Grade 1 endometrial cancer status post supracervical hysterectomy with morcellation. Clinically free of disease.    Plan continue Megace 40 mg twice a day to complete 2 years of therapy if possible.  Return to see me in 3 months. Pap smear is pending from today. The patient will contact us when she wants to have her Port-A-Cath removed.  Interval history: Patient returns today for continuing followup. Since her last visit she found that her appetite was significantly increased with Megace and she is reduced dose to Megace to twice a day. Otherwise she denies any abdominal pain pressure and has no GI or GU symptoms. She denies any pelvic pain pressure vaginal bleeding or discharge.     HPI: 78 year old white female seen in consultation regarding management of a newly diagnosed endometrial cancer. Review of records show the patient had abnormal bleeding for a number of months. Evaluation has included an endometrial biopsy and D&C neither of which revealed endometrial cancer. However it is noted in the patient's operative note that the [facility] specimen did not show any definitive endometrial epithelium. Ultimately, because of heavy bleeding, the patient underwent surgery on 03/15/2013. Procedure was a laparoscopic supracervical hysterectomy which required morcellation to remove the uterus. Final pathology revealed a 225 g specimen which was morcellated. It is the pathologist opinion that the aggregate tumor size is approximately 6 cm. Currently depth of invasion could not be determined the tubes and ovaries appeared not to be involved with cancer.  The patient's postoperative course was uncomplicated.  We initiated adjuvant therapy planning 6 cycles of carboplatin and Taxol. However the patient tolerated the chemotherapy very poorly and only received 2 cycles. She did have intracavitary brachytherapy  of her cervical stump which went well.. she was also treated with cervical brachytherapy  Review of Systems:10 point review of systems is negative except as noted in interval history.   Vitals: There were no vitals taken for this visit.  Physical Exam: General : The patient is a healthy woman in no acute distress.  HEENT: normocephalic, extraoccular movements normal; neck is supple without thyromegally  Lynphnodes: Supraclavicular and inguinal nodes not enlarged  Abdomen: Soft, non-tender, no ascites, no organomegally, no masses, no hernias , the abdomen is obese and has a well-healed transverse incision in the epigastrium allegedly for a hernia repair. Pelvic:  EGBUS: Normal female  Vagina: Normal, no lesions  Urethra and Bladder: Normal, non-tender  Cervix: Difficult to visualize because the patient's habitus but seems to be normal. Uterus: Surgically absent  Bi-manual examination: Non-tender; no adenxal masses or nodularity  Rectal: normal sphincter tone, no masses, no blood  Lower extremities: No edema or varicosities. Normal range of motion      Allergies  Allergen Reactions  . Statins Itching  . Sulfur Hives  . Ciprofloxacin Hcl Rash    Past Medical History  Diagnosis Date  . Hypertension   . Diabetes mellitus without complication   . Arthritis   . A-fib   . Hx of migraines   . CHF (congestive heart failure)   . Neuropathy     FINGERS AND TOES  . Complication of anesthesia     PROLONGED EFFECT - SHORT TERM MEMORY PROBLEMS  . Shortness of breath     WITH EXERTION  . Sleep apnea     USES C-PAP  . Kidney tumor     encapsulated cancer on  L kidney  . Nocturia   . History of kidney stones   . Cancer 03/2013    Endometrial cancer / kidney cancer removed  . Port-a-cath in place   . History of radiation therapy 08/08/13, 08/15/13, 08/22/13, 09/12/13, 09/27/13    cervical stump 29.5 gray    Past Surgical History  Procedure Laterality Date  . Tumor removal  2012     Kidney  . Replacement total knee bilateral      10 yrs ago, 29 yrs ago  . Ankle surgery Left   . Abdominal hysterectomy  03/15/2013  . Dilation and curettage of uterus  12/2012  . Hernia repair      x2  . Examination under anesthesia N/A 08/08/2013    Procedure:  exam under anesthesia with placementof a tandem ring for high dose radiation;  Surgeon: Imagene Gurney A. Alycia Rossetti, MD;  Location: [facility];  Service: Gynecology;  Laterality: N/A;    Current Outpatient Prescriptions  Medication Sig Dispense Refill  . Cholecalciferol (VITAMIN D-3) 1000 UNITS CAPS Take 1 capsule by mouth daily.       . ciprofloxacin (CIPRO) 500 MG tablet       . digoxin (LANOXIN) 0.125 MG tablet Take 0.125 mg by mouth every Monday, Wednesday, and Friday.       . enoxaparin (LOVENOX) 120 MG/0.8ML injection Inject 0.75 mLs (115 mg total) into the skin every 12 (twelve) hours.  5 Syringe  3  . ferrous sulfate 325 (65 FE) MG tablet Take 325 mg by mouth daily with breakfast.      . fish oil-omega-3 fatty acids 1000 MG capsule Take 1 g by mouth 2 (two) times daily.       . folic acid (FOLVITE) 1 MG tablet Take 1 mg by mouth daily.      . furosemide (LASIX) 80 MG tablet Take 120 mg by mouth daily with lunch. Pt reports taking 2 pills daily      . gabapentin (NEURONTIN) 300 MG capsule Take 300 mg by mouth 3 (three) times daily.       . insulin glargine (LANTUS) 100 UNIT/ML injection Inject 40-51 Units into the skin 2 (two) times daily. 51u in am, 40u in pm      . insulin lispro (HUMALOG) 100 UNIT/ML injection Inject 8-9 Units into the skin 2 (two) times daily. 8u in am, 9u in pm      . megestrol (MEGACE) 40 MG tablet Take 40 mg by mouth 3 (three) times daily.       . potassium chloride SA (K-DUR,KLOR-CON) 20 MEQ tablet Take 20 mEq by mouth 2 (two) times daily.       . rosuvastatin (CRESTOR) 10 MG tablet Take 10 mg by mouth every morning.       . traMADol (ULTRAM) 50 MG tablet Take 50 mg by mouth 2 (two) times daily as needed for pain.        . verapamil (CALAN-SR) 180 MG CR tablet Take 180 mg by mouth 2 (two) times daily.      . vitamin B-12 (CYANOCOBALAMIN) 1000 MCG tablet Take 1,000 mcg by mouth daily.      . vitamin C (ASCORBIC ACID) 500 MG tablet Take 500 mg by mouth daily.      Marland Kitchen warfarin (COUMADIN) 7.5 MG tablet Take 3.75-7.5 mg by mouth daily. 1 tab daily except 0.5 tab on Tues and Thurs       No current facility-administered medications for this visit.   Facility-Administered Medications Ordered  in Other Visits  Medication Dose Route Frequency Provider Last Rate Last Dose  . sodium chloride 0.9 % injection 10 mL  10 mL Intracatheter PRN Deatra Robinson, MD        History   Social History  . Marital Status: Legally Separated    Spouse Name: N/A    Number of Children: 4  . Years of Education: N/A   Occupational History  . Not on file.   Social History Main Topics  . Smoking status: Never Smoker   . Smokeless tobacco: Never Used  . Alcohol Use: No  . Drug Use: No  . Sexual Activity: No   Other Topics Concern  . Not on file   Social History Narrative  . No narrative on file    Family History  Problem Relation Age of Onset  . Diabetes Mother   . Stroke Mother   . Atrial fibrillation Mother   . Hypertension Mother   . Diabetes Maternal Grandmother   . Heart attack Father       Alvino Chapel, MD 12/11/2013, 9:53 AM

## 2013-12-11 NOTE — Patient Instructions (Signed)
To see [place] in 3 months. Continue taking Megace 40 mg twice a day. Contact us if you want to have the Port-A-Cath removed.

## 2013-12-15 ENCOUNTER — Telehealth: Payer: Self-pay | Admitting: *Deleted

## 2013-12-15 NOTE — Telephone Encounter (Signed)
Called pt spoke with daughter, gave pap smear results, normal. Daughter advised she was pt's POA and will let her know.

## 2013-12-15 NOTE — Telephone Encounter (Signed)
Message copied by Lucile Crater on Fri Dec 15, 2013  5:27 PM ------      Message from: [name] D      Created: Fri Dec 15, 2013  5:14 PM       Please let her know that her pap smear was normal.            ----- Message -----         From: [facility]         Sent: 12/15/2013   3:54 PM           To: Monica Gibbs, NP                   ------

## 2013-12-18 ENCOUNTER — Ambulatory Visit (HOSPITAL_BASED_OUTPATIENT_CLINIC_OR_DEPARTMENT_OTHER): Payer: Medicare Other

## 2013-12-18 VITALS — BP 160/80 | HR 88 | Temp 98.5°F

## 2013-12-18 DIAGNOSIS — Z95828 Presence of other vascular implants and grafts: Secondary | ICD-10-CM

## 2013-12-18 DIAGNOSIS — Z452 Encounter for adjustment and management of vascular access device: Secondary | ICD-10-CM

## 2013-12-18 DIAGNOSIS — C549 Malignant neoplasm of corpus uteri, unspecified: Secondary | ICD-10-CM

## 2013-12-18 MED ORDER — HEPARIN SOD (PORK) LOCK FLUSH 100 UNIT/ML IV SOLN
500.0000 [IU] | Freq: Once | INTRAVENOUS | Status: AC
  Administered 2013-12-18: 500 [IU] via INTRAVENOUS
  Filled 2013-12-18: qty 5

## 2013-12-18 MED ORDER — SODIUM CHLORIDE 0.9 % IJ SOLN
10.0000 mL | INTRAMUSCULAR | Status: DC | PRN
  Administered 2013-12-18: 10 mL via INTRAVENOUS
  Filled 2013-12-18: qty 10

## 2014-03-19 ENCOUNTER — Ambulatory Visit: Payer: Medicare Other | Attending: Gynecology | Admitting: Gynecology

## 2014-03-19 ENCOUNTER — Encounter: Payer: Self-pay | Admitting: Gynecology

## 2014-03-19 ENCOUNTER — Other Ambulatory Visit (HOSPITAL_COMMUNITY)
Admission: RE | Admit: 2014-03-19 | Discharge: 2014-03-19 | Disposition: A | Discharge status: Home / Self Care | Payer: Medicare Other | Source: Ambulatory Visit | Attending: Gynecology | Admitting: Gynecology

## 2014-03-19 VITALS — BP 160/79 | HR 63 | Temp 98.1°F | Resp 18 | Ht 60.0 in | Wt 260.9 lb

## 2014-03-19 DIAGNOSIS — G4730 Sleep apnea, unspecified: Secondary | ICD-10-CM | POA: Insufficient documentation

## 2014-03-19 DIAGNOSIS — I10 Essential (primary) hypertension: Secondary | ICD-10-CM | POA: Diagnosis not present

## 2014-03-19 DIAGNOSIS — Z79899 Other long term (current) drug therapy: Secondary | ICD-10-CM | POA: Insufficient documentation

## 2014-03-19 DIAGNOSIS — G589 Mononeuropathy, unspecified: Secondary | ICD-10-CM | POA: Diagnosis not present

## 2014-03-19 DIAGNOSIS — C549 Malignant neoplasm of corpus uteri, unspecified: Secondary | ICD-10-CM | POA: Insufficient documentation

## 2014-03-19 DIAGNOSIS — I509 Heart failure, unspecified: Secondary | ICD-10-CM | POA: Diagnosis not present

## 2014-03-19 DIAGNOSIS — E119 Type 2 diabetes mellitus without complications: Secondary | ICD-10-CM | POA: Insufficient documentation

## 2014-03-19 DIAGNOSIS — Z7901 Long term (current) use of anticoagulants: Secondary | ICD-10-CM | POA: Diagnosis not present

## 2014-03-19 DIAGNOSIS — Z9221 Personal history of antineoplastic chemotherapy: Secondary | ICD-10-CM | POA: Insufficient documentation

## 2014-03-19 DIAGNOSIS — Z96659 Presence of unspecified artificial knee joint: Secondary | ICD-10-CM | POA: Diagnosis not present

## 2014-03-19 DIAGNOSIS — Z794 Long term (current) use of insulin: Secondary | ICD-10-CM | POA: Diagnosis not present

## 2014-03-19 DIAGNOSIS — C541 Malignant neoplasm of endometrium: Secondary | ICD-10-CM

## 2014-03-19 DIAGNOSIS — Z8542 Personal history of malignant neoplasm of other parts of uterus: Secondary | ICD-10-CM

## 2014-03-19 DIAGNOSIS — Z87442 Personal history of urinary calculi: Secondary | ICD-10-CM | POA: Diagnosis not present

## 2014-03-19 DIAGNOSIS — Z124 Encounter for screening for malignant neoplasm of cervix: Secondary | ICD-10-CM | POA: Diagnosis present

## 2014-03-19 NOTE — Progress Notes (Signed)
Consult Note: Gyn-Onc   Monica Compton 78 y.o. female  Chief Complaint  Patient presents with  . endo ca    Follow up     Assessment : Grade 1 endometrial cancer status post supracervical hysterectomy with morcellation. Clinically free of disease.    Plan continue Megace 40 mg once a day to complete 2 years of therapy if possible. She will see Dr. Sondra Come  in 3 months  Return to see me in 6 months. Pap smear is pending from today.   Interval history: Patient returns today for continuing followup. Since her last visit she found that her appetite was significantly increased with Megace and she is reduced dose to Megace to 1 a day. Otherwise she denies any abdominal pain pressure and has no GI or GU symptoms. She denies any pelvic pain pressure vaginal bleeding or discharge. She has recently moved into an assisted living.     HPI: 78 year old white female seen in consultation regarding management of a newly diagnosed endometrial cancer. Review of records show the patient had abnormal bleeding for a number of months. Evaluation has included an endometrial biopsy and D&C neither of which revealed endometrial cancer. However it is noted in the patient's operative note that the [facility] specimen did not show any definitive endometrial epithelium. Ultimately, because of heavy bleeding, the patient underwent surgery on 03/15/2013. Procedure was a laparoscopic supracervical hysterectomy which required morcellation to remove the uterus. Final pathology revealed a 225 g specimen which was morcellated. It is the pathologist opinion that the aggregate tumor size is approximately 6 cm. Currently depth of invasion could not be determined the tubes and ovaries appeared not to be involved with cancer.  The patient's postoperative course was uncomplicated.  We initiated adjuvant therapy planning 6 cycles of carboplatin and Taxol. However the patient tolerated the chemotherapy very poorly and only received 2  cycles. She did have intracavitary brachytherapy of her cervical stump which went well.. she was also treated with cervical brachytherapy. She was placed on Megace.  Review of Systems:10 point review of systems is negative except as noted in interval history.   Vitals: Blood pressure 160/79, pulse 63, temperature 98.1 F (36.7 C), temperature source Oral, resp. rate 18, height 5' (1.524 m), weight 260 lb 14.4 oz (118.343 kg).  Physical Exam: General : The patient is a healthy woman in no acute distress.  HEENT: normocephalic, extraoccular movements normal; neck is supple without thyromegally  Lynphnodes: Supraclavicular and inguinal nodes not enlarged  Abdomen: Soft, non-tender, no ascites, no organomegally, no masses, no hernias , the abdomen is obese and has a well-healed transverse incision in the epigastrium allegedly for a hernia repair. Pelvic:  EGBUS: Normal female  Vagina: Normal, no lesions  Urethra and Bladder: Normal, non-tender  Cervix: Difficult to visualize because the patient's habitus but seems to be normal. Uterus: Surgically absent  Bi-manual examination: Non-tender; no adenxal masses or nodularity  Rectal: normal sphincter tone, no masses, no blood  Lower extremities: No edema or varicosities. Normal range of motion      Allergies  Allergen Reactions  . Statins Itching  . Sulfur Hives  . Ciprofloxacin Hcl Rash    Past Medical History  Diagnosis Date  . Hypertension   . Diabetes mellitus without complication   . Arthritis   . A-fib   . Hx of migraines   . CHF (congestive heart failure)   . Neuropathy     FINGERS AND TOES  . Complication of anesthesia  PROLONGED EFFECT - SHORT TERM MEMORY PROBLEMS  . Shortness of breath     WITH EXERTION  . Sleep apnea     USES C-PAP  . 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 Nocturia   . History of kidney stones   . Cancer 03/2013    Endometrial cancer / kidney cancer removed  . Port-a-cath in  place   . History of radiation therapy 08/08/13, 08/15/13, 08/22/13, 09/12/13, 09/27/13    cervical stump 29.5 gray    Past Surgical History  Procedure Laterality Date  . Tumor removal  2012    Kidney  . Replacement total knee bilateral      10 yrs ago, 29 yrs ago  . Ankle surgery Left   . Abdominal hysterectomy  03/15/2013  . Dilation and curettage of uterus  12/2012  . Hernia repair      x2  . Examination under anesthesia N/A 08/08/2013    Procedure:  exam under anesthesia with placementof a tandem ring for high dose radiation;  Surgeon: Imagene Gurney A. Alycia Rossetti, MD;  Location: [facility];  Service: Gynecology;  Laterality: N/A;    Current Outpatient Prescriptions  Medication Sig Dispense Refill  . Cholecalciferol (VITAMIN D-3) 1000 UNITS CAPS Take 1 capsule by mouth daily.       . ciprofloxacin (CIPRO) 500 MG tablet       . digoxin (LANOXIN) 0.125 MG tablet Take 0.125 mg by mouth every Monday, Wednesday, and Friday.       . enoxaparin (LOVENOX) 120 MG/0.8ML injection Inject 0.75 mLs (115 mg total) into the skin every 12 (twelve) hours.  5 Syringe  3  . ferrous sulfate 325 (65 FE) MG tablet Take 325 mg by mouth daily with breakfast.      . fish oil-omega-3 fatty acids 1000 MG capsule Take 1 g by mouth 2 (two) times daily.       . furosemide (LASIX) 80 MG tablet Take 120 mg by mouth daily with lunch. Pt reports taking 2 pills daily      . gabapentin (NEURONTIN) 300 MG capsule Take 300 mg by mouth 3 (three) times daily.       . insulin glargine (LANTUS) 100 UNIT/ML injection Inject 40-51 Units into the skin 2 (two) times daily. 51u in am, 40u in pm      . insulin lispro (HUMALOG) 100 UNIT/ML injection Inject 8-9 Units into the skin 2 (two) times daily. 8u in am, 9u in pm      . megestrol (MEGACE) 40 MG tablet Take 40 mg by mouth 3 (three) times daily.       . rosuvastatin (CRESTOR) 10 MG tablet Take 10 mg by mouth every morning.       . traMADol (ULTRAM) 50 MG tablet Take 50 mg by mouth 2 (two) times  daily as needed for pain.       . verapamil (CALAN-SR) 180 MG CR tablet Take 180 mg by mouth 2 (two) times daily.      . vitamin B-12 (CYANOCOBALAMIN) 1000 MCG tablet Take 1,000 mcg by mouth daily.      . vitamin C (ASCORBIC ACID) 500 MG tablet Take 500 mg by mouth daily.      Marland Kitchen warfarin (COUMADIN) 7.5 MG tablet Take 3.75-7.5 mg by mouth daily. 1 tab daily except 0.5 tab on Tues and Thurs      . folic acid (FOLVITE) 1 MG tablet Take 1 mg by mouth daily.      Marland Kitchen  potassium chloride SA (K-DUR,KLOR-CON) 20 MEQ tablet Take 20 mEq by mouth 2 (two) times daily.        No current facility-administered medications for this visit.   Facility-Administered Medications Ordered in Other Visits  Medication Dose Route Frequency Derisha Funderburke Last Rate Last Dose  . sodium chloride 0.9 % injection 10 mL  10 mL Intracatheter PRN Deatra Robinson, MD        History   Social History  . Marital Status: Legally Separated    Spouse Name: N/A    Number of Children: 4  . Years of Education: N/A   Occupational History  . Not on file.   Social History Main Topics  . Smoking status: Never Smoker   . Smokeless tobacco: Never Used  . Alcohol Use: No  . Drug Use: No  . Sexual Activity: No   Other Topics Concern  . Not on file   Social History Narrative  . No narrative on file    Family History  Problem Relation Age of Onset  . Diabetes Mother   . Stroke Mother   . Atrial fibrillation Mother   . Hypertension Mother   . Diabetes Maternal Grandmother   . Heart attack Father       Alvino Chapel, MD 03/19/2014, 9:40 AM

## 2014-03-19 NOTE — Addendum Note (Signed)
Addended by: Lucile Crater on: 03/19/2014 09:59 AM   Modules accepted: Orders

## 2014-03-19 NOTE — Patient Instructions (Addendum)
Return to see me in 6 months. We will contact you if you're Pap smear report. Follow up with Dr. Sondra Come in 3 months

## 2014-03-21 LAB — CYTOLOGY - PAP

## 2014-03-22 ENCOUNTER — Telehealth: Payer: Self-pay | Admitting: *Deleted

## 2014-03-22 ENCOUNTER — Ambulatory Visit: Payer: Medicare Other | Admitting: Radiation Oncology

## 2014-03-22 NOTE — Telephone Encounter (Signed)
Message copied by Brittaney Beaulieu, Aletha Halim on Thu Mar 22, 2014  1:50 PM ------      Message from: Marti Sleigh      Created: Thu Mar 22, 2014 12:38 PM       Please let herknow the pap is normal--Thanks,      ----- Message -----         From: [facility] Interface         Sent: 03/21/2014   4:16 PM           To: Alvino Chapel, MD                   ------

## 2014-03-22 NOTE — Telephone Encounter (Signed)
Notified pt pap smear normal. 

## 2014-04-01 IMAGING — CT NM PET TUM IMG INITIAL (PI) SKULL BASE T - THIGH
6 series · 25 of 25 positions shown · IV contrast (350 OM)
Comparison: None

CLINICAL DATA: Initial treatment strategy for endometrial
carcinoma..  Endometrial carcinoma

NUCLEAR MEDICINE PET SKULL BASE TO THIGH
Fasting Blood Glucose:  162
TECHNIQUE: 18.3 mCi F-18 FDG was injected intravenously.   CT data
was obtained and used for attenuation correction and anatomic
localization only.  (This was not acquired as a diagnostic CT
examination.) Additional exam technical data entered  on
technologist worksheet.

[Series 1: pet ac · axial · 3.3mm · 4.69mm/px · z∈[-870,+0]mm · 5 of 267 slices shown]
[im 1/267]
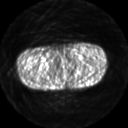
[im 67/267]
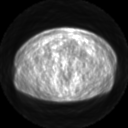
[im 134/267]
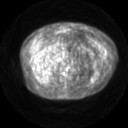
[im 200/267]
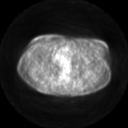
[im 267/267]
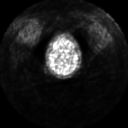

[Series 2: ct images · axial · 3.8mm · 0.98mm/px · z∈[-870,+0]mm · 5 of 265 slices shown]
[im 1/265]
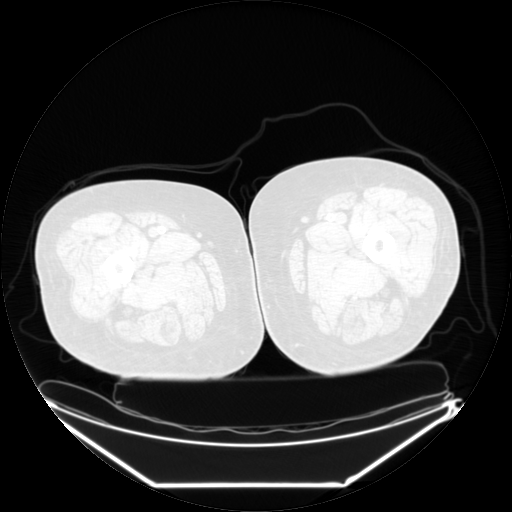
[im 67/265]
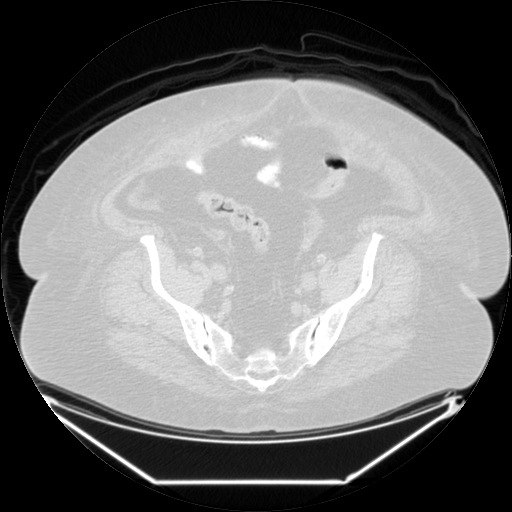
[im 133/265]
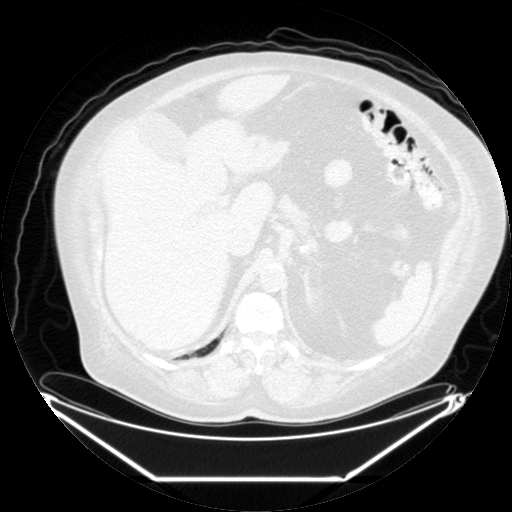
[im 199/265]
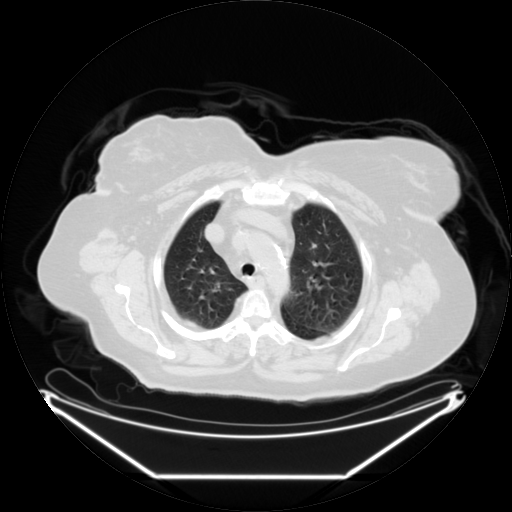
[im 265/265  brain]
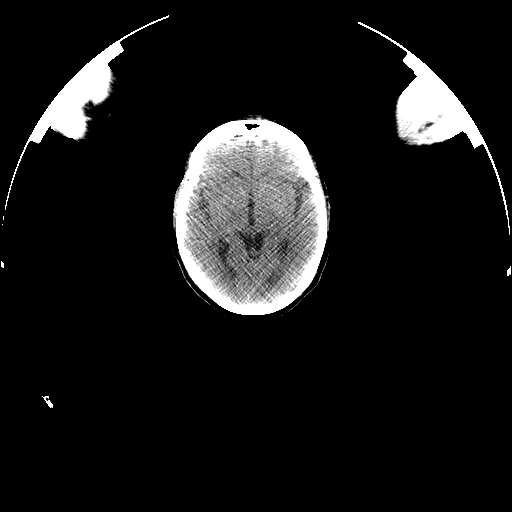

[Series 2: pet nac · axial · 3.3mm · 4.69mm/px · z∈[-870,+0]mm · 6 of 267 slices shown]
[im 1/267]
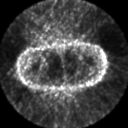
[im 54/267]
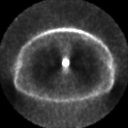
[im 107/267]
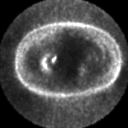
[im 160/267]
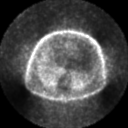
[im 213/267]
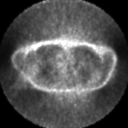
[im 267/267]
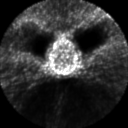

[Series 123: mip · coronal · 3.3mm · 4.69mm/px · 1 of 30 slices shown]
[im 1/30]
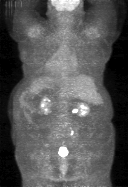

[Series 151: reformatted · axial · 3.3mm · 3.91mm/px · z∈[-870,+0]mm · 6 of 267 slices shown (1 of 2)]
[im 1/267]
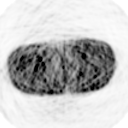
[im 54/267]
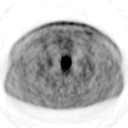
[im 107/267]
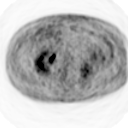
[im 160/267]
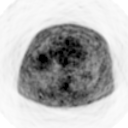
[im 213/267]
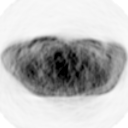
[im 267/267]
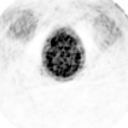

[Series 153: reformatted · coronal · 4.7mm · 6.98mm/px · 2 of 84 slices shown (2 of 2)]
[im 1/84]
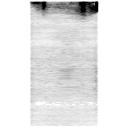
[im 84/84]
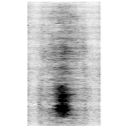

[25 of 25 positions shown; findings below may reference images not displayed]

FINDINGS: Neck: No hypermetabolic nodes in the neck.

Chest: No hypermetabolic mediastinal or hilar nodes.  No suspicious
pulmonary nodules.

Abdomen / Pelvis:Post hysterectomy. Small common iliac and nodes on
the right measure less than 10 mm (image 185)..  No hypermetabolic
pelvic lymph nodes.

 No abnormal hypermetabolic activity within the solid organs.  No
evidence of abdominal or pelvic hypermetabolic nodes.

Skeleton:No focal hypermetabolic activity to suggest skeletal
metastasis.
IMPRESSION: No evidence of local endometrial cancer metastasis or distant
metastasis.

## 2014-06-20 ENCOUNTER — Encounter: Payer: Self-pay | Admitting: Oncology

## 2014-06-21 ENCOUNTER — Ambulatory Visit
Admission: RE | Admit: 2014-06-21 | Discharge: 2014-06-21 | Disposition: A | Discharge status: Home / Self Care | Payer: Medicare Other | Source: Ambulatory Visit | Attending: Radiation Oncology | Admitting: Radiation Oncology

## 2014-06-21 ENCOUNTER — Encounter: Payer: Self-pay | Admitting: Radiation Oncology

## 2014-06-21 VITALS — BP 148/62 | HR 69 | Temp 97.8°F | Resp 20 | Ht 60.0 in | Wt 253.1 lb

## 2014-06-21 DIAGNOSIS — C541 Malignant neoplasm of endometrium: Secondary | ICD-10-CM

## 2014-06-21 NOTE — Progress Notes (Signed)
Monica Compton here for follow up after treatment for endometrial cancer.  She reports pain in her mid back that started when she started treatment.  She rates it at a 7/10 today.  She reports tramadol eases the pain a little.  She denies bladder issues, bowel issues, nausea and skin irritation.  She reports fatigue.  She has lost 7 lbs since she was last seen in June.  She reports her appetite is OK but she does not eat a lot.

## 2014-06-21 NOTE — Progress Notes (Signed)
Radiation Oncology         [phone] ________________________________  Name: Monica Compton MRN: [id number]  Date: 06/21/2014  DOB: [birth year]  Follow-Up Visit Note  CC: Robyn Haber, MD  Deatra Robinson, MD  Diagnosis:   Grade 1 endometrial cancer status post supracervical hysterectomy with morcellation.   Interval Since Last Radiation:  9  months  Narrative:  The patient returns today for routine follow-up.  She seems to be doing well at this time. She is accompanied by her daughter on evaluation today. Patient has recently moved into assisted living and is getting adjusted to this issue. She was seen by gynecologic oncology approximately 3 months ago with good report at that time. Patient denies any pelvic pain vaginal bleeding urination difficulties or bowel complaints.                              ALLERGIES:  is allergic to statins; sulfur; and ciprofloxacin hcl.  Meds: Current Outpatient Prescriptions  Medication Sig Dispense Refill  . digoxin (LANOXIN) 0.125 MG tablet Take 0.125 mg by mouth every Monday, Wednesday, and Friday.       . furosemide (LASIX) 80 MG tablet Take 120 mg by mouth daily with lunch. Pt reports taking 2 pills daily      . gabapentin (NEURONTIN) 300 MG capsule Take 300 mg by mouth 3 (three) times daily.       . insulin glargine (LANTUS) 100 UNIT/ML injection Inject 40-51 Units into the skin 2 (two) times daily. 51u in am, 40u in pm      . insulin lispro (HUMALOG) 100 UNIT/ML injection Inject 8-9 Units into the skin 2 (two) times daily. 8u in am, 9u in pm      . megestrol (MEGACE) 40 MG tablet Take 40 mg by mouth 3 (three) times daily.       . Multiple Vitamins-Minerals (MULTIVITAMIN WITH MINERALS) tablet Take 1 tablet by mouth daily.      . potassium chloride SA (K-DUR,KLOR-CON) 20 MEQ tablet Take 20 mEq by mouth 2 (two) times daily.       . rosuvastatin (CRESTOR) 10 MG tablet Take 10 mg by mouth every morning.       . traMADol (ULTRAM) 50 MG  tablet Take 50 mg by mouth 2 (two) times daily as needed for pain.       . verapamil (CALAN-SR) 180 MG CR tablet Take 180 mg by mouth 2 (two) times daily.      Marland Kitchen warfarin (COUMADIN) 7.5 MG tablet Take 3.75-7.5 mg by mouth daily. 1 tab daily except 0.5 tab on Tues and Thurs      . Cholecalciferol (VITAMIN D-3) 1000 UNITS CAPS Take 1 capsule by mouth daily.       Marland Kitchen enoxaparin (LOVENOX) 120 MG/0.8ML injection Inject 0.75 mLs (115 mg total) into the skin every 12 (twelve) hours.  5 Syringe  3  . ferrous sulfate 325 (65 FE) MG tablet Take 325 mg by mouth daily with breakfast.      . fish oil-omega-3 fatty acids 1000 MG capsule Take 1 g by mouth 2 (two) times daily.       . folic acid (FOLVITE) 1 MG tablet Take 1 mg by mouth daily.      . vitamin B-12 (CYANOCOBALAMIN) 1000 MCG tablet Take 1,000 mcg by mouth daily.      . vitamin C (ASCORBIC ACID) 500 MG tablet Take 500 mg by mouth  daily.       No current facility-administered medications for this encounter.   Facility-Administered Medications Ordered in Other Encounters  Medication Dose Route Frequency Provider Last Rate Last Dose  . sodium chloride 0.9 % injection 10 mL  10 mL Intracatheter PRN Deatra Robinson, MD        Physical Findings: The patient is in no acute distress. Patient is alert and oriented.  height is 5' (1.524 m) and weight is 253 lb 1.6 oz (114.805 kg). Her oral temperature is 97.8 F (36.6 C). Her blood pressure is 148/62 and her pulse is 69. Her respiration is 20. Marland Kitchen No palpable subclavicular or axillary adenopathy. Lungs are clear to auscultation. The heart has a regular rhythm and rate. The abdomen is soft and nontender with normal bowel sounds. On pelvic examination there note palpable inguinal adenopathy. The external genitalia are unremarkable. Speculum exam is performed. The cervix is difficult to visualize but radiation changes were noted in the proximal vagina. The Pap smears pain of the cervical/upper vaginal area. On  bimanual and rectovaginal examination there no pelvic masses appreciated.  Lab Findings: Lab Results  Component Value Date   WBC 9.4 08/29/2013   HGB 11.2* 08/29/2013   HCT 34.8 08/29/2013   MCV 84.9 08/29/2013   PLT 213 08/29/2013      Radiographic Findings: No results found.  Impression:  No evidence of recurrence on clinical exam today, Pap smear pending  Plan:  Routine followup in 6 months. In the interim the patient will be seen by gynecologic oncology.  ____________________________________ Blair Promise, MD

## 2014-06-22 ENCOUNTER — Encounter: Payer: Self-pay | Admitting: Radiation Oncology

## 2014-06-25 ENCOUNTER — Telehealth: Payer: Self-pay | Admitting: Oncology

## 2014-06-25 ENCOUNTER — Telehealth: Payer: Self-pay | Admitting: *Deleted

## 2014-06-25 NOTE — Telephone Encounter (Signed)
Santiago Glad, RN from radiation oncology called stating that patient's daughter is inquiring about PAC flush for her mom. Per Santiago Glad, pt port hasn't been flushed since March 2015. Called and spoke with daughter and she said her mom isn't getting anymore chemo so they would like the port removed. Patient sees Dr. Aldean Ast and Dr. Sondra Come alternating every 3 months. Passed information along to [place], RN in gyn-onc and she will assist them in scheduling PAC removal.

## 2014-06-25 NOTE — Telephone Encounter (Signed)
Called and spoke to Monica Compton, Monica Compton in Dr. Mariana Kaufman office regarding Monica Compton not having any port flush appointments.  She is going to check in to this.  Monica Compton, Monica Compton's daughter, regarding her e-mail.  Advised her that medical oncology is working on Monica Compton for port flushes.  Also let her know that Monica Compton will need to come to the [facility] for flushes and not home health because she is ambulatory.  Monica Compton verbalized agreement.

## 2014-06-26 ENCOUNTER — Telehealth: Payer: Self-pay | Admitting: *Deleted

## 2014-06-26 DIAGNOSIS — C541 Malignant neoplasm of endometrium: Secondary | ICD-10-CM

## 2014-06-26 LAB — CYTOLOGY - PAP

## 2014-06-26 NOTE — Telephone Encounter (Signed)
Email sent to Dr. Wayland Salinas regarding pt PAC removal.

## 2014-06-26 NOTE — Telephone Encounter (Signed)
Per Dr. Wayland Salinas , ok to have PAC removed, called pt to confirm request and advise scheduling will be calling to make appt for pt.

## 2014-06-27 ENCOUNTER — Telehealth: Payer: Self-pay | Admitting: *Deleted

## 2014-06-27 NOTE — Telephone Encounter (Signed)
Call placed to Monica Compton regarding PAC removal. IR will be calling pt or [name] to set up appt.

## 2014-06-27 NOTE — Telephone Encounter (Signed)
Call placed to IR, order entered for PAC removal. Pt will need to be off Coumadin x 4 days prior to [facility] removal.  Call placed to Dr. Faythe Dingwall office , [phone], to confirm pt is taking Coumadin. Pt is not taking Lovenox.  Request MD send clearance for pt to be off coumadin x 4days for PAC removal.  Gave Fax# to RN at [facility]. Awaiting fax prior to scheduling procedure.

## 2014-06-28 ENCOUNTER — Telehealth: Payer: Self-pay | Admitting: *Deleted

## 2014-06-28 ENCOUNTER — Other Ambulatory Visit: Payer: Self-pay | Admitting: *Deleted

## 2014-06-28 NOTE — Telephone Encounter (Signed)
Fax received from pt's PCP at [facility] , pt cleared to stop Coumadin x 4days for PAC removal. Fax sent to HIM to be scanned.

## 2014-07-05 ENCOUNTER — Ambulatory Visit: Payer: Self-pay | Admitting: Radiation Oncology

## 2014-07-06 ENCOUNTER — Other Ambulatory Visit: Payer: Self-pay | Admitting: Radiology

## 2014-07-09 ENCOUNTER — Ambulatory Visit (HOSPITAL_COMMUNITY)
Admission: RE | Admit: 2014-07-09 | Discharge: 2014-07-09 | Disposition: A | Discharge status: Home / Self Care | Payer: Medicare Other | Source: Ambulatory Visit | Attending: Gynecology | Admitting: Gynecology

## 2014-07-09 ENCOUNTER — Encounter (HOSPITAL_COMMUNITY): Payer: Self-pay

## 2014-07-09 DIAGNOSIS — Z923 Personal history of irradiation: Secondary | ICD-10-CM | POA: Insufficient documentation

## 2014-07-09 DIAGNOSIS — Z8542 Personal history of malignant neoplasm of other parts of uterus: Secondary | ICD-10-CM | POA: Diagnosis not present

## 2014-07-09 DIAGNOSIS — C541 Malignant neoplasm of endometrium: Secondary | ICD-10-CM

## 2014-07-09 DIAGNOSIS — Z794 Long term (current) use of insulin: Secondary | ICD-10-CM | POA: Diagnosis not present

## 2014-07-09 DIAGNOSIS — Z452 Encounter for adjustment and management of vascular access device: Secondary | ICD-10-CM | POA: Diagnosis present

## 2014-07-09 DIAGNOSIS — I509 Heart failure, unspecified: Secondary | ICD-10-CM | POA: Insufficient documentation

## 2014-07-09 DIAGNOSIS — I10 Essential (primary) hypertension: Secondary | ICD-10-CM | POA: Diagnosis not present

## 2014-07-09 DIAGNOSIS — Z85528 Personal history of other malignant neoplasm of kidney: Secondary | ICD-10-CM | POA: Insufficient documentation

## 2014-07-09 DIAGNOSIS — Z7901 Long term (current) use of anticoagulants: Secondary | ICD-10-CM | POA: Diagnosis not present

## 2014-07-09 DIAGNOSIS — Z79899 Other long term (current) drug therapy: Secondary | ICD-10-CM | POA: Insufficient documentation

## 2014-07-09 LAB — CBC WITH DIFFERENTIAL/PLATELET
Basophils Absolute: 0 10*3/uL (ref 0.0–0.1)
Basophils Relative: 0 % (ref 0–1)
EOS PCT: 2 % (ref 0–5)
Eosinophils Absolute: 0.2 10*3/uL (ref 0.0–0.7)
HCT: 37.8 % (ref 36.0–46.0)
HEMOGLOBIN: 12.2 g/dL (ref 12.0–15.0)
LYMPHS ABS: 3 10*3/uL (ref 0.7–4.0)
LYMPHS PCT: 28 % (ref 12–46)
MCH: 29.3 pg (ref 26.0–34.0)
MCHC: 32.3 g/dL (ref 30.0–36.0)
MCV: 90.6 fL (ref 78.0–100.0)
Monocytes Absolute: 0.7 10*3/uL (ref 0.1–1.0)
Monocytes Relative: 7 % (ref 3–12)
Neutro Abs: 6.7 10*3/uL (ref 1.7–7.7)
Neutrophils Relative %: 63 % (ref 43–77)
PLATELETS: 266 10*3/uL (ref 150–400)
RBC: 4.17 MIL/uL (ref 3.87–5.11)
RDW: 15.5 % (ref 11.5–15.5)
WBC: 10.6 10*3/uL — AB (ref 4.0–10.5)

## 2014-07-09 LAB — PROTIME-INR
INR: 1.24 (ref 0.00–1.49)
Prothrombin Time: 15.7 seconds — ABNORMAL HIGH (ref 11.6–15.2)

## 2014-07-09 LAB — GLUCOSE, CAPILLARY: Glucose-Capillary: 252 mg/dL — ABNORMAL HIGH (ref 70–99)

## 2014-07-09 MED ORDER — SODIUM CHLORIDE 0.9 % IV SOLN
INTRAVENOUS | Status: DC
  Administered 2014-07-09: 500 mL via INTRAVENOUS

## 2014-07-09 MED ORDER — FENTANYL CITRATE 0.05 MG/ML IJ SOLN
INTRAMUSCULAR | Status: AC | PRN
  Administered 2014-07-09 (×2): 25 ug via INTRAVENOUS

## 2014-07-09 MED ORDER — CEFAZOLIN SODIUM-DEXTROSE 2-3 GM-% IV SOLR
2.0000 g | Freq: Once | INTRAVENOUS | Status: AC
  Administered 2014-07-09: 2 g via INTRAVENOUS

## 2014-07-09 MED ORDER — CEFAZOLIN SODIUM-DEXTROSE 2-3 GM-% IV SOLR
INTRAVENOUS | Status: AC
  Administered 2014-07-09: 2 g via INTRAVENOUS
  Filled 2014-07-09: qty 50

## 2014-07-09 MED ORDER — FENTANYL CITRATE 0.05 MG/ML IJ SOLN
INTRAMUSCULAR | Status: AC
  Filled 2014-07-09: qty 6

## 2014-07-09 MED ORDER — MIDAZOLAM HCL 2 MG/2ML IJ SOLN
INTRAMUSCULAR | Status: AC | PRN
  Administered 2014-07-09: 1 mg via INTRAVENOUS
  Administered 2014-07-09: 0.5 mg via INTRAVENOUS

## 2014-07-09 MED ORDER — MIDAZOLAM HCL 2 MG/2ML IJ SOLN
INTRAMUSCULAR | Status: AC
  Filled 2014-07-09: qty 6

## 2014-07-09 MED ORDER — LIDOCAINE HCL 1 % IJ SOLN
INTRAMUSCULAR | Status: AC
  Filled 2014-07-09: qty 20

## 2014-07-09 NOTE — Procedures (Signed)
Procedure:  Port removal Right chest port removed in entirety. No complications.

## 2014-07-09 NOTE — H&P (Signed)
For port removal today.

## 2014-07-09 NOTE — Discharge Instructions (Signed)
Incision Care °An incision is when a surgeon cuts into your body tissues. After surgery, the incision needs to be cared for properly to prevent infection.  °HOME CARE INSTRUCTIONS  °· Take all medicine as directed by your caregiver. Only take over-the-counter or prescription medicines for pain, discomfort, or fever as directed by your caregiver. °· Do not remove your bandage (dressing) or get your incision wet until your surgeon gives you permission. In the event that your dressing becomes wet, dirty, or starts to smell, change the dressing and call your surgeon for instructions as soon as possible. °· Take showers. Do not take tub baths, swim, or do anything that may soak the wound until it is healed. °· Resume your normal diet and activities as directed or allowed. °· Avoid lifting any weight until you are instructed otherwise. °· Use anti-itch antihistamine medicine as directed by your caregiver. The wound may itch when it is healing. Do not pick or scratch at the wound. °· Follow up with your caregiver for stitch (suture) or staple removal as directed. °· Drink enough fluids to keep your urine clear or pale yellow. °SEEK MEDICAL CARE IF:  °· You have redness, swelling, or increasing pain in the wound that is not controlled with medicine. °· You have drainage, blood, or pus coming from the wound that lasts longer than 1 day. °· You develop muscle aches, chills, or a general ill feeling. °· You notice a bad smell coming from the wound or dressing. °· Your wound edges separate after the sutures, staples, or skin adhesive strips have been removed. °· You develop persistent nausea or vomiting. °SEEK IMMEDIATE MEDICAL CARE IF:  °· You have a fever. °· You develop a rash. °· You develop dizzy episodes or faint while standing. °· You have difficulty breathing. °· You develop any reaction or side effects to medicine given. °MAKE SURE YOU:  °· Understand these instructions. °· Will watch your condition. °· Will get help  right away if you are not doing well or get worse. °Document Released: 03/27/2005 Document Revised: 11/30/2011 Document Reviewed: 11/01/2013 °ExitCare® Patient Information ©2015 [employer]. This information is not intended to replace advice given to you by your health care provider. Make sure you discuss any questions you have with your health care provider. °Conscious Sedation °Sedation is the use of medicines to promote relaxation and relieve discomfort and anxiety. Conscious sedation is a type of sedation. Under conscious sedation you are less alert than normal but are still able to respond to instructions or stimulation. Conscious sedation is used during short medical and dental procedures. It is milder than deep sedation or general anesthesia and allows you to return to your regular activities sooner.  °LET YOUR HEALTH CARE PROVIDER KNOW ABOUT:  °· Any allergies you have. °· All medicines you are taking, including vitamins, herbs, eye drops, creams, and over-the-counter medicines. °· Use of steroids (by mouth or creams). °· Previous problems you or members of your family have had with the use of anesthetics. °· Any blood disorders you have. °· Previous surgeries you have had. °· Medical conditions you have. °· Possibility of pregnancy, if this applies. °· Use of cigarettes, alcohol, or illegal drugs. °RISKS AND COMPLICATIONS °Generally, this is a safe procedure. However, as with any procedure, problems can occur. Possible problems include: °· Oversedation. °· Trouble breathing on your own. You may need to have a breathing tube until you are awake and breathing on your own. °· Allergic reaction to any of   the medicines used for the procedure. °BEFORE THE PROCEDURE °· You may have blood tests done. These tests can help show how well your kidneys and liver are working. They can also show how well your blood clots. °· A physical exam will be done.   °· Only take medicines as directed by your health care provider.  You may need to stop taking medicines (such as blood thinners, aspirin, or nonsteroidal anti-inflammatory drugs) before the procedure.   °· Do not eat or drink at least 6 hours before the procedure or as directed by your health care provider. °· Arrange for a responsible adult, family member, or friend to take you home after the procedure. He or she should stay with you for at least 24 hours after the procedure, until the medicine has worn off. °PROCEDURE  °· An intravenous (IV) catheter will be inserted into one of your veins. Medicine will be able to flow directly into your body through this catheter. You may be given medicine through this tube to help prevent pain and help you relax. °· The medical or dental procedure will be done. °AFTER THE PROCEDURE °· You will stay in a recovery area until the medicine has worn off. Your blood pressure and pulse will be checked.   °·  Depending on the procedure you had, you may be allowed to go home when you can tolerate liquids and your pain is under control. °Document Released: 06/02/2001 Document Revised: 09/12/2013 Document Reviewed: 05/15/2013 °ExitCare® Patient Information ©2015 [employer]. This information is not intended to replace advice given to you by your health care provider. Make sure you discuss any questions you have with your health care provider. ° °

## 2014-07-09 NOTE — H&P (Signed)
Chief Complaint: "I'm here to get my port out"  Referring Physician(s): Clarke-Pearson,Daniel L  History of Present Illness: Monica Compton is a 78 y.o. female with history of endometrial carcinoma and currently free of disease who presents today for port a cath removal.  Past Medical History  Diagnosis Date  . Hypertension   . Diabetes mellitus without complication   . Arthritis   . A-fib   . Hx of migraines   . CHF (congestive heart failure)   . Neuropathy     FINGERS AND TOES  . Complication of anesthesia     PROLONGED EFFECT - SHORT TERM MEMORY PROBLEMS  . Shortness of breath     WITH EXERTION  . Sleep apnea     USES C-PAP  . 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 Nocturia   . History of kidney stones   . Cancer 03/2013    Endometrial cancer / kidney cancer removed  . Port-a-cath in place   . History of radiation therapy 08/08/13, 08/15/13, 08/22/13, 09/12/13, 09/27/13    cervical stump 29.5 gray    Past Surgical History  Procedure Laterality Date  . Tumor removal  2012    Kidney  . Replacement total knee bilateral      10 yrs ago, 29 yrs ago  . Ankle surgery Left   . Abdominal hysterectomy  03/15/2013  . Dilation and curettage of uterus  12/2012  . Hernia repair      x2  . Examination under anesthesia N/A 08/08/2013    Procedure:  exam under anesthesia with placementof a tandem ring for high dose radiation;  Surgeon: Imagene Gurney A. Alycia Rossetti, MD;  Location: [facility];  Service: Gynecology;  Laterality: N/A;    Allergies: Statins; Sulfur; and Ciprofloxacin hcl  Medications: Prior to Admission medications   Medication Sig Start Date End Date Taking? Authorizing Provider  acetaminophen (TYLENOL) 325 MG tablet Take 325 mg by mouth every 6 (six) hours as needed.   Yes Historical Provider, MD  Cholecalciferol (VITAMIN D-3) 1000 UNITS CAPS Take 1 capsule by mouth daily.    Yes Historical Provider, MD  digoxin (LANOXIN) 0.125 MG tablet Take 0.125 mg by mouth  every Monday, Wednesday, and Friday.    Yes Historical Provider, MD  ferrous sulfate 325 (65 FE) MG tablet Take 325 mg by mouth daily with breakfast.   Yes Historical Provider, MD  fish oil-omega-3 fatty acids 1000 MG capsule Take 1 g by mouth 2 (two) times daily.    Yes Historical Provider, MD  folic acid (FOLVITE) 1 MG tablet Take 1 mg by mouth daily.   Yes Historical Provider, MD  furosemide (LASIX) 80 MG tablet Take 160 mg by mouth daily with lunch. Pt reports taking 2 pills daily   Yes Historical Provider, MD  gabapentin (NEURONTIN) 300 MG capsule Take 300 mg by mouth 3 (three) times daily.    Yes Historical Provider, MD  insulin glargine (LANTUS) 100 UNIT/ML injection Inject 50-60 Units into the skin 2 (two) times daily. 60u in am, 50u in pm   Yes Historical Provider, MD  insulin lispro (HUMALOG) 100 UNIT/ML injection Inject 12 Units into the skin 2 (two) times daily. 8u in am, 9u in pm   Yes Historical Provider, MD  megestrol (MEGACE) 40 MG tablet Take 40 mg by mouth daily.  11/06/13  Yes Historical Provider, MD  Multiple Vitamins-Minerals (MULTIVITAMIN WITH MINERALS) tablet Take 1 tablet by mouth daily.  Yes Historical Provider, MD  potassium chloride SA (K-DUR,KLOR-CON) 20 MEQ tablet Take 20 mEq by mouth 2 (two) times daily.  06/15/13  Yes Historical Provider, MD  rosuvastatin (CRESTOR) 10 MG tablet Take 10 mg by mouth every morning.    Yes Historical Provider, MD  traMADol (ULTRAM) 50 MG tablet Take 50 mg by mouth 2 (two) times daily as needed for pain.    Yes Historical Provider, MD  verapamil (CALAN-SR) 180 MG CR tablet Take 180 mg by mouth 2 (two) times daily.   Yes Historical Provider, MD  vitamin B-12 (CYANOCOBALAMIN) 1000 MCG tablet Take 1,000 mcg by mouth daily.   Yes Historical Provider, MD  vitamin C (ASCORBIC ACID) 500 MG tablet Take 500 mg by mouth daily.   Yes Historical Provider, MD  warfarin (COUMADIN) 7.5 MG tablet Take 3.75-7.5 mg by mouth daily. 1 tab daily except 0.5 tab on  Tues and Thurs    Historical Provider, MD    Family History  Problem Relation Age of Onset  . Diabetes Mother   . Stroke Mother   . Atrial fibrillation Mother   . Hypertension Mother   . Diabetes Maternal Grandmother   . Heart attack Father     History   Social History  . Marital Status: Legally Separated    Spouse Name: N/A    Number of Children: 4  . Years of Education: N/A   Social History Main Topics  . Smoking status: Never Smoker   . Smokeless tobacco: Never Used  . Alcohol Use: No  . Drug Use: No  . Sexual Activity: No   Other Topics Concern  . None   Social History Narrative  . None         Review of Systems  Constitutional: Negative for fever and chills.  Respiratory: Negative for cough and shortness of breath.   Cardiovascular: Negative for chest pain.  Gastrointestinal: Negative for nausea, vomiting, abdominal pain and blood in stool.  Genitourinary: Negative for dysuria and hematuria.  Musculoskeletal: Negative for back pain.  Neurological: Negative for headaches.  Hematological: Does not bruise/bleed easily.    Vital Signs: BP 152/54  Pulse 52  Temp(Src) 98.6 F (37 C)  Resp 18  Ht 5' (1.524 m)  Wt 253 lb (114.76 kg)  BMI 49.41 kg/m2  SpO2 96%  Physical Exam  Constitutional: She is oriented to person, place, and time. She appears well-developed and well-nourished.  Cardiovascular:  Bradycardic,irreg  Pulmonary/Chest: Effort normal and breath sounds normal.  Clean, intact rt chest wall PAC  Abdominal: Soft. Bowel sounds are normal. There is no tenderness.  obese  Musculoskeletal: Normal range of motion. She exhibits edema.  Neurological: She is alert and oriented to person, place, and time.    Imaging: No results found.  Labs:  CBC:  Recent Labs  07/17/13 1208 08/04/13 1130 08/29/13 0846 07/09/14 1200  WBC 9.0 10.8* 9.4 10.6*  HGB 9.4* 10.9* 11.2* 12.2  HCT 30.9* 35.5* 34.8 37.8  PLT 290 278 213 266     COAGS:  Recent Labs  08/04/13 1130 08/08/13 0606 07/09/14 1200  INR 1.97* 1.27 1.24  APTT 42*  --   --     BMP:  Recent Labs  07/17/13 1208 08/04/13 1130  NA 145 143  K 3.7 3.5  CL  --  103  CO2 28 32  GLUCOSE 142* 185*  BUN 13.8 10  CALCIUM 9.5 9.4  CREATININE 1.0 0.87  GFRNONAA  --  64*  GFRAA  --  74*    LIVER FUNCTION TESTS:  Recent Labs  07/17/13 1208  BILITOT 0.70  AST 13  ALT 9  ALKPHOS 71  PROT 7.0  ALBUMIN 2.5*    TUMOR MARKERS: No results found for this basename: AFPTM, CEA, CA199, CHROMGRNA,  in the last 8760 hours  Assessment and Plan: Monica Compton is a 78 y.o. female with history of endometrial carcinoma and currently free of disease who presents today for port a cath removal. Details/risks of procedure d/w pt/daughter with their understanding and consent.          Signed: Autumn Messing 07/09/2014, 1:04 PM

## 2014-07-23 ENCOUNTER — Encounter (HOSPITAL_COMMUNITY): Payer: Self-pay

## 2014-08-09 IMAGING — CR DG CHEST 2V
2 series · 2 of 2 positions shown · non-contrast
Comparison: None.

CLINICAL DATA: Coughing and wheezing and short of breath.

EXAM:
CHEST  2 VIEW

[w chest pa]
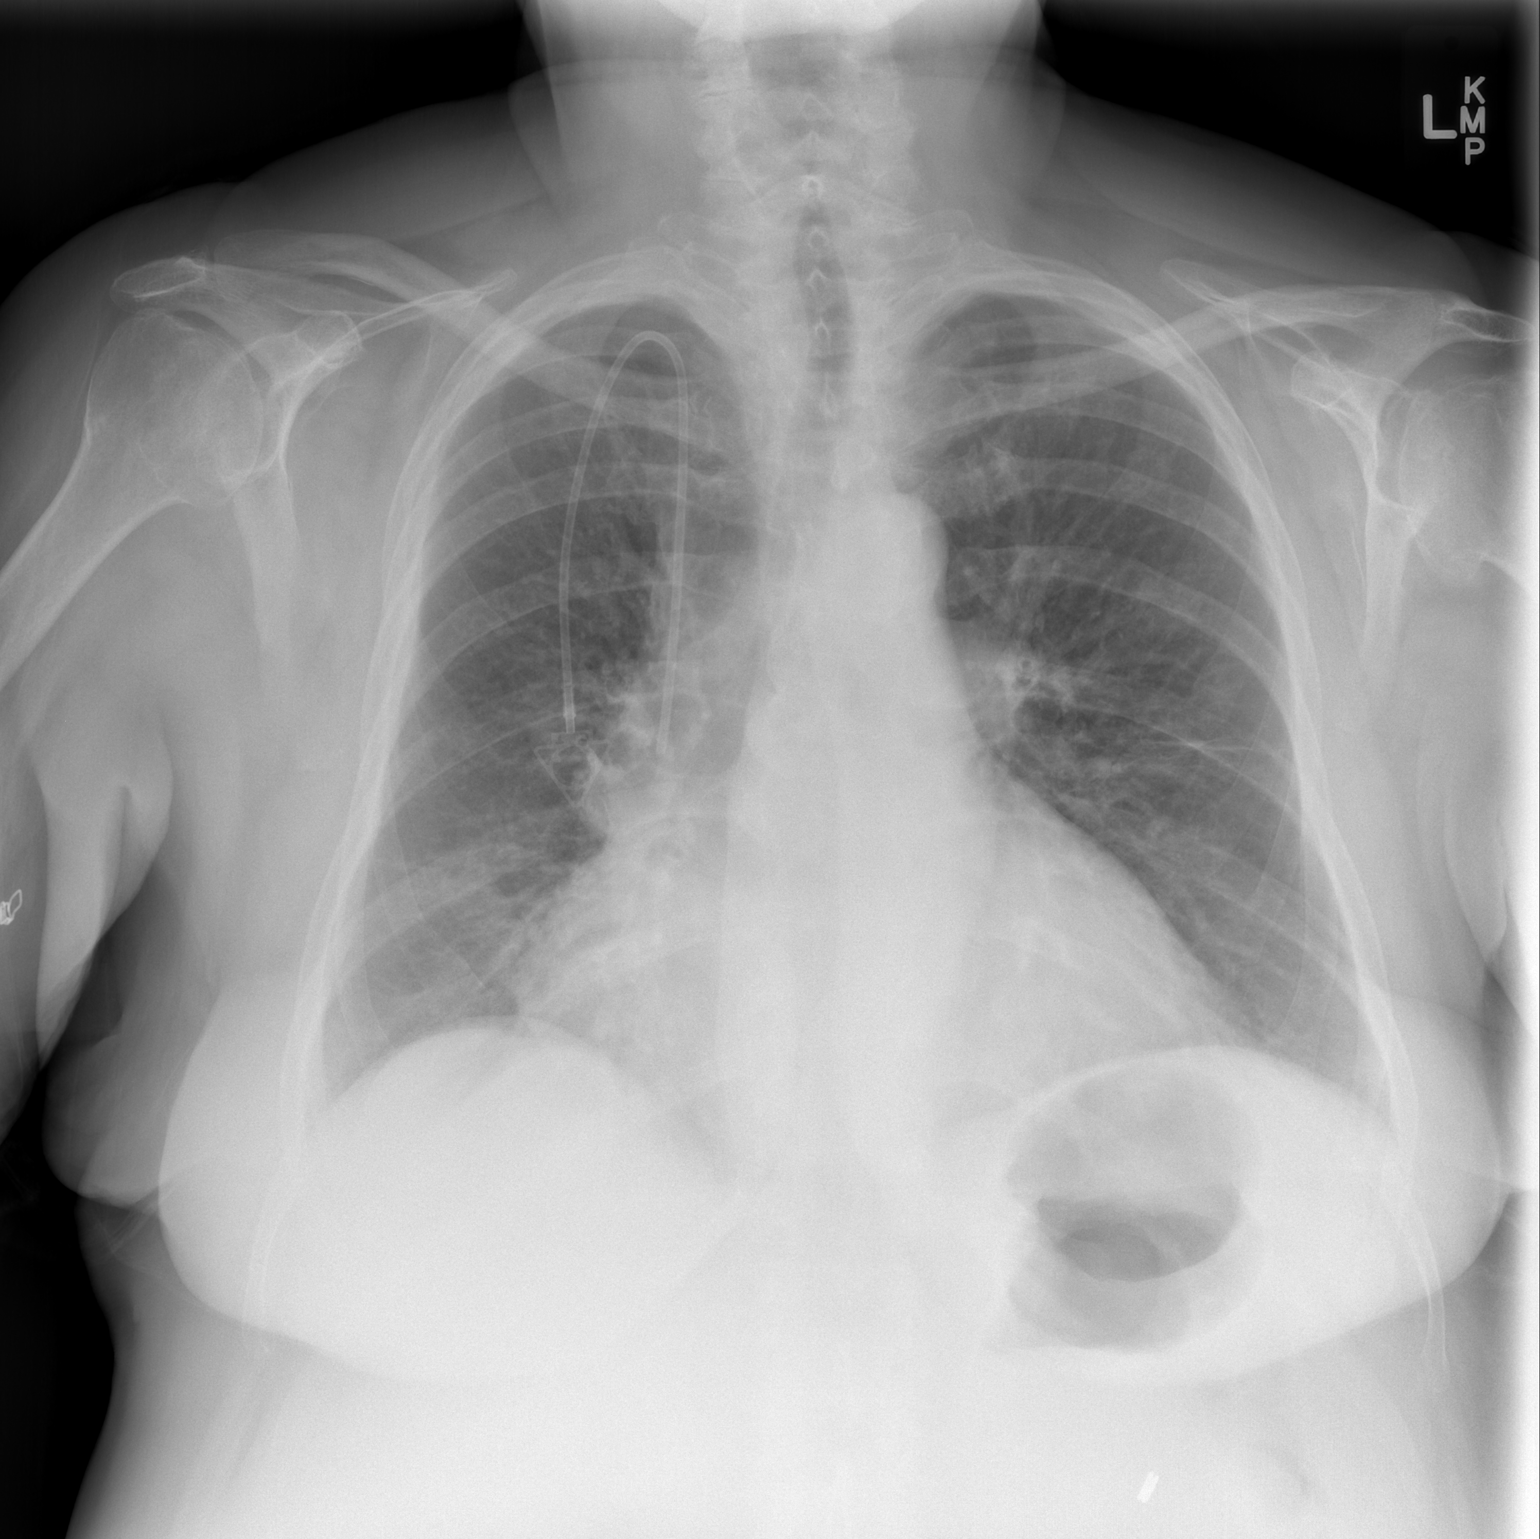

[w chest lat]
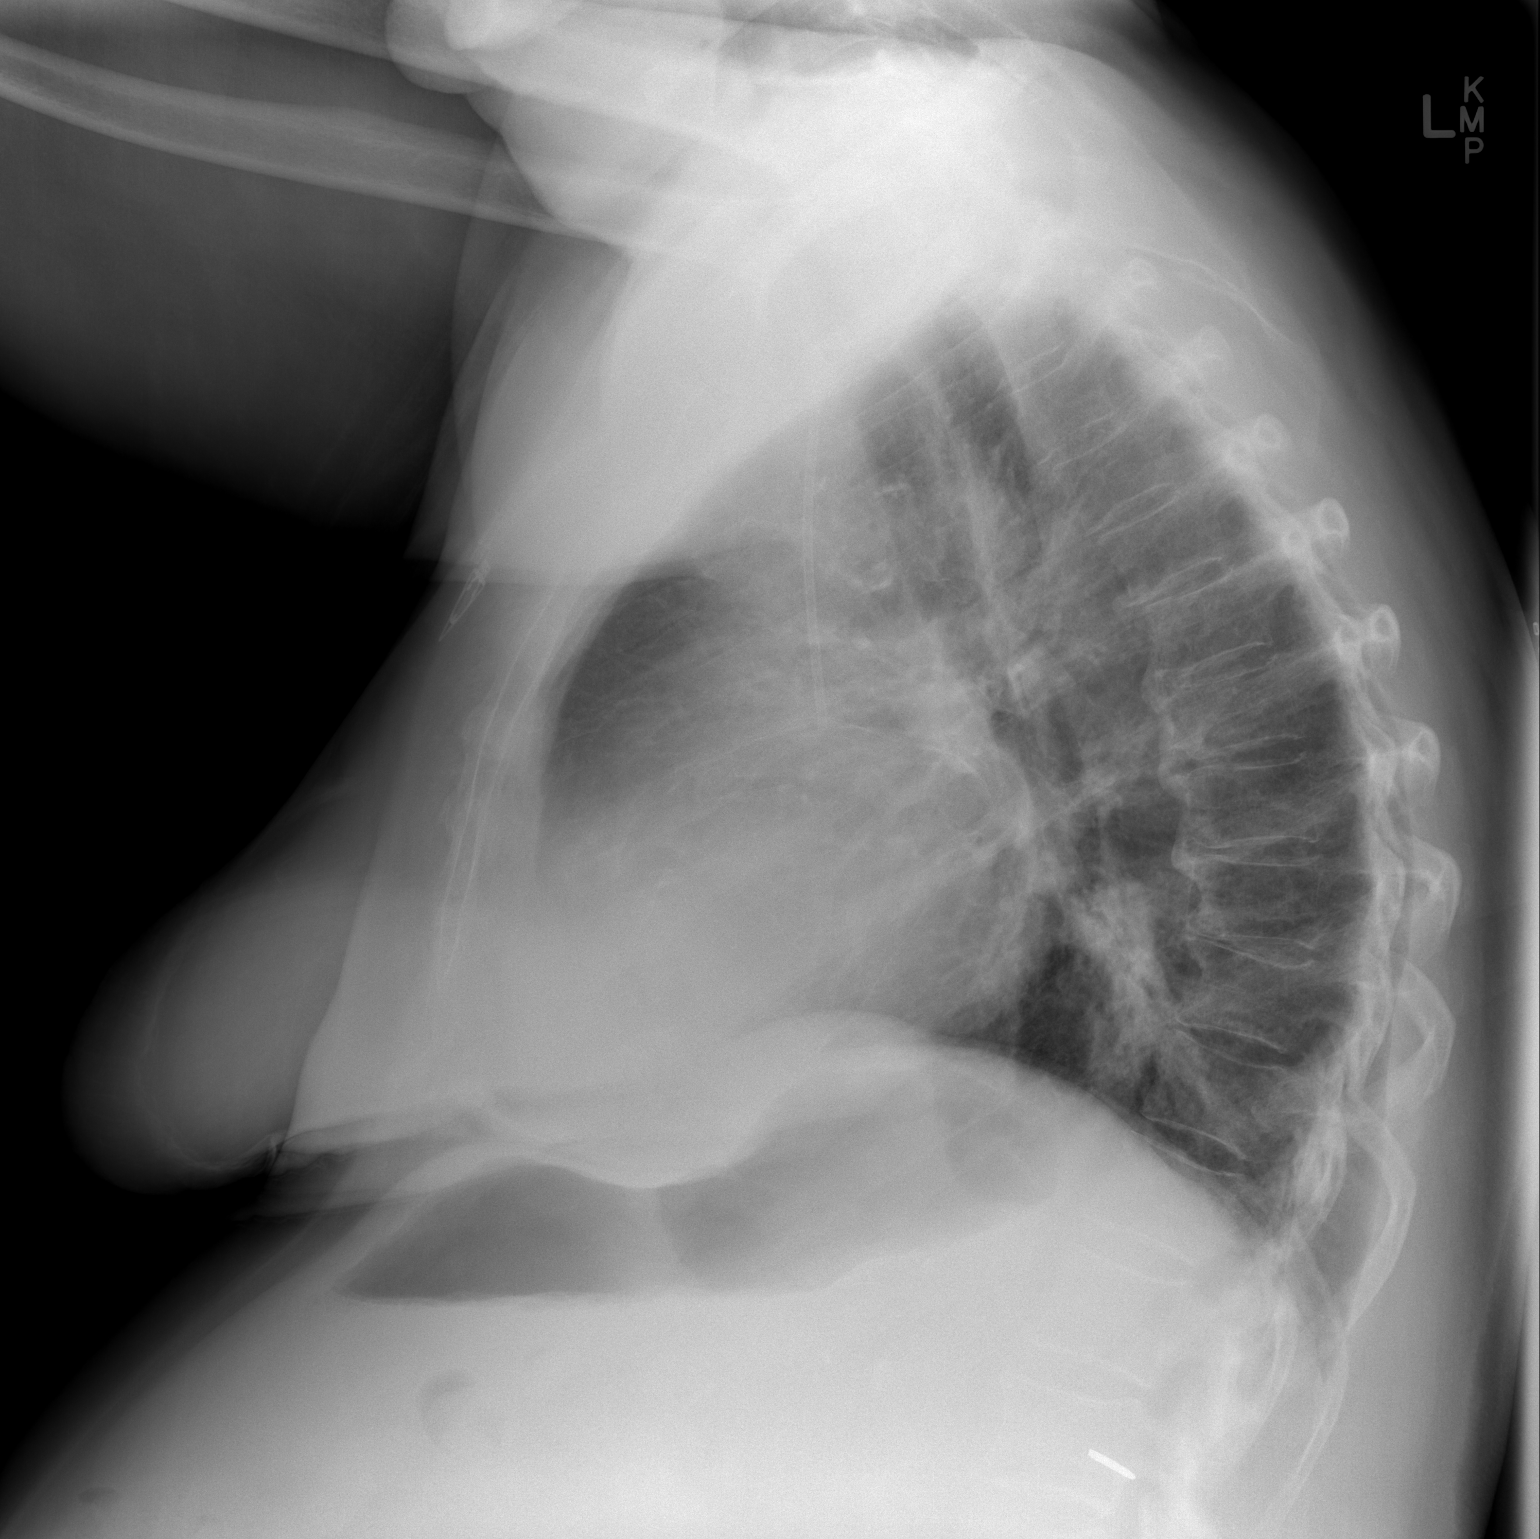

[2 of 2 positions shown; findings below may reference images not displayed]

FINDINGS: Cardiac silhouette is mildly enlarged. The aorta is mildly uncoiled.
No mediastinal or hilar masses.

There is increased opacity projecting in the posterior lung base on
the lateral view likely in the right lower lobe. This may reflect an
area of infiltrated or atelectasis/scarring. The lungs are otherwise
clear. No pleural effusion or pneumothorax.

A right anterior chest wall power Port-A-Cath has its tip in the
lower superior vena cava.

The bony thorax is demineralized but grossly intact.
IMPRESSION: Right lower lobe opacity seen on the lateral view may be due to
chronic atelectasis/ scarring. This could reflect an area of
infiltrate, however. No other evidence of acute cardiopulmonary
disease.

## 2014-09-03 ENCOUNTER — Encounter: Payer: Self-pay | Admitting: Gynecology

## 2014-09-03 ENCOUNTER — Ambulatory Visit: Payer: Medicare Other | Attending: Gynecology | Admitting: Gynecology

## 2014-09-03 ENCOUNTER — Other Ambulatory Visit (HOSPITAL_COMMUNITY)
Admission: RE | Admit: 2014-09-03 | Discharge: 2014-09-03 | Disposition: A | Discharge status: Home / Self Care | Payer: Medicare Other | Source: Ambulatory Visit | Attending: Gynecology | Admitting: Gynecology

## 2014-09-03 VITALS — BP 153/59 | HR 55 | Temp 98.3°F | Resp 18 | Ht 60.0 in | Wt 250.5 lb

## 2014-09-03 DIAGNOSIS — Z7901 Long term (current) use of anticoagulants: Secondary | ICD-10-CM | POA: Insufficient documentation

## 2014-09-03 DIAGNOSIS — I509 Heart failure, unspecified: Secondary | ICD-10-CM | POA: Insufficient documentation

## 2014-09-03 DIAGNOSIS — I10 Essential (primary) hypertension: Secondary | ICD-10-CM | POA: Insufficient documentation

## 2014-09-03 DIAGNOSIS — G4730 Sleep apnea, unspecified: Secondary | ICD-10-CM | POA: Insufficient documentation

## 2014-09-03 DIAGNOSIS — I4891 Unspecified atrial fibrillation: Secondary | ICD-10-CM | POA: Insufficient documentation

## 2014-09-03 DIAGNOSIS — Z01411 Encounter for gynecological examination (general) (routine) with abnormal findings: Secondary | ICD-10-CM | POA: Insufficient documentation

## 2014-09-03 DIAGNOSIS — Z794 Long term (current) use of insulin: Secondary | ICD-10-CM | POA: Diagnosis not present

## 2014-09-03 DIAGNOSIS — Z79899 Other long term (current) drug therapy: Secondary | ICD-10-CM | POA: Insufficient documentation

## 2014-09-03 DIAGNOSIS — E119 Type 2 diabetes mellitus without complications: Secondary | ICD-10-CM | POA: Insufficient documentation

## 2014-09-03 DIAGNOSIS — C541 Malignant neoplasm of endometrium: Secondary | ICD-10-CM | POA: Insufficient documentation

## 2014-09-03 DIAGNOSIS — Z8542 Personal history of malignant neoplasm of other parts of uterus: Secondary | ICD-10-CM

## 2014-09-03 DIAGNOSIS — Z90710 Acquired absence of both cervix and uterus: Secondary | ICD-10-CM | POA: Insufficient documentation

## 2014-09-03 NOTE — Patient Instructions (Signed)
Dr. Fermin Schwab would like to see you back in 6 months.

## 2014-09-03 NOTE — Progress Notes (Signed)
Consult Note: Gyn-Onc   Monica Compton 78 y.o. female  Chief Complaint  Patient presents with  . endometrial cancer    Assessment : Grade 1 endometrial cancer status post supracervical hysterectomy with morcellation. Clinically free of disease.    Plan continue Megace 40 mg once a day to complete 2 years of therapy if possible. She will see Dr. Sondra Compton  in 3 months  Return to see me in 6 months. Pap smear is pending from today.   Interval history: Patient returns today for continuing followup. She is taking Megace 40 mg daily. (Previously we had her on Megace 3 times a day but she felt it was making her dizzy) Otherwise she denies any abdominal pain pressure and has no GI or GU symptoms. She denies any pelvic pain pressure vaginal bleeding or discharge.     HPI: 78 year old white female seen in consultation regarding management of a newly diagnosed endometrial cancer. Review of records show the patient had abnormal bleeding for a number of months. Evaluation has included an endometrial biopsy and D&C neither of which revealed endometrial cancer. However it is noted in the patient's operative note that the [facility] specimen did not show any definitive endometrial epithelium. Ultimately, because of heavy bleeding, the patient underwent surgery on 03/15/2013. Procedure was a laparoscopic supracervical hysterectomy which required morcellation to remove the uterus. Final pathology revealed a 225 g specimen which was morcellated. It is the pathologist opinion that the aggregate tumor size is approximately 6 cm. Currently depth of invasion could not be determined the tubes and ovaries appeared not to be involved with cancer.  The patient's postoperative course was uncomplicated.  We initiated adjuvant therapy planning 6 cycles of carboplatin and Taxol. However the patient tolerated the chemotherapy very poorly and only received 2 cycles. She did have intracavitary brachytherapy of her cervical stump  which went well.. she was also treated with cervical brachytherapy. She was placed on Megace.  Review of Systems:10 point review of systems is negative except as noted in interval history.   Vitals: Blood pressure 153/59, pulse 55, temperature 98.3 F (36.8 C), temperature source Oral, resp. rate 18, height 5' (1.524 m), weight 250 lb 8 oz (113.626 kg).  Physical Exam: General : The patient is a healthy woman in no acute distress.  HEENT: normocephalic, extraoccular movements normal; neck is supple without thyromegally  Lynphnodes: Supraclavicular and inguinal nodes not enlarged  Abdomen: Soft, non-tender, no ascites, no organomegally, no masses, no hernias , the abdomen is obese and has a well-healed transverse incision in the epigastrium allegedly for a hernia repair. Pelvic:  EGBUS: Normal female  Vagina: Normal, no lesions  Urethra and Bladder: Normal, non-tender  Cervix: Difficult to visualize because the patient's habitus but seems to be normal. Uterus: Surgically absent  Bi-manual examination: Non-tender; no adenxal masses or nodularity  Rectal: normal sphincter tone, no masses, no blood  Lower extremities: No edema or varicosities. Normal range of motion      Allergies  Allergen Reactions  . Statins Itching  . Sulfur Hives  . Ciprofloxacin Hcl Rash    Past Medical History  Diagnosis Date  . Hypertension   . Diabetes mellitus without complication   . Arthritis   . A-fib   . Hx of migraines   . CHF (congestive heart failure)   . Neuropathy     FINGERS AND TOES  . Complication of anesthesia     PROLONGED EFFECT - SHORT TERM MEMORY PROBLEMS  . Shortness of breath  WITH EXERTION  . Sleep apnea     USES C-PAP  . 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 Nocturia   . History of kidney stones   . Cancer 03/2013    Endometrial cancer / kidney cancer removed  . Port-a-cath in place   . History of radiation therapy 08/08/13, 08/15/13, 08/22/13, 09/12/13,  09/27/13    cervical stump 29.5 gray    Past Surgical History  Procedure Laterality Date  . Tumor removal  2012    Kidney  . Replacement total knee bilateral      10 yrs ago, 29 yrs ago  . Ankle surgery Left   . Abdominal hysterectomy  03/15/2013  . Dilation and curettage of uterus  12/2012  . Hernia repair      x2  . Examination under anesthesia N/A 08/08/2013    Procedure:  exam under anesthesia with placementof a tandem ring for high dose radiation;  Surgeon: Imagene Gurney A. Alycia Rossetti, MD;  Location: [facility];  Service: Gynecology;  Laterality: N/A;    Current Outpatient Prescriptions  Medication Sig Dispense Refill  . acetaminophen (TYLENOL) 325 MG tablet Take 325 mg by mouth every 6 (six) hours as needed.    . Cholecalciferol (VITAMIN D-3) 1000 UNITS CAPS Take 1 capsule by mouth daily.     . digoxin (LANOXIN) 0.125 MG tablet Take 0.125 mg by mouth every Monday, Wednesday, and Friday.     . ferrous sulfate 325 (65 FE) MG tablet Take 325 mg by mouth daily with breakfast.    . fish oil-omega-3 fatty acids 1000 MG capsule Take 1 g by mouth 2 (two) times daily.     . folic acid (FOLVITE) 1 MG tablet Take 1 mg by mouth daily.    . furosemide (LASIX) 80 MG tablet Take 160 mg by mouth daily with lunch. Pt reports taking 2 pills daily    . gabapentin (NEURONTIN) 300 MG capsule Take 300 mg by mouth 3 (three) times daily.     . insulin glargine (LANTUS) 100 UNIT/ML injection Inject 50-60 Units into the skin 2 (two) times daily. 60u in am, 50u in pm    . insulin lispro (HUMALOG) 100 UNIT/ML injection Inject 12 Units into the skin 2 (two) times daily. 8u in am, 9u in pm    . megestrol (MEGACE) 40 MG tablet Take 40 mg by mouth daily.     . Multiple Vitamins-Minerals (MULTIVITAMIN WITH MINERALS) tablet Take 1 tablet by mouth daily.    . potassium chloride SA (K-DUR,KLOR-CON) 20 MEQ tablet Take 20 mEq by mouth 2 (two) times daily.     . rosuvastatin (CRESTOR) 10 MG tablet Take 10 mg by mouth every morning.      . TRADJENTA 5 MG TABS tablet Take 5 mg by mouth daily.  5  . traMADol (ULTRAM) 50 MG tablet Take 50 mg by mouth 2 (two) times daily as needed for pain.     Marland Kitchen UNIFINE PENTIPS 31G X 8 MM MISC   3  . verapamil (CALAN-SR) 180 MG CR tablet Take 180 mg by mouth 2 (two) times daily.    . vitamin B-12 (CYANOCOBALAMIN) 1000 MCG tablet Take 1,000 mcg by mouth daily.    . vitamin C (ASCORBIC ACID) 500 MG tablet Take 500 mg by mouth daily.    Marland Kitchen warfarin (COUMADIN) 7.5 MG tablet Take 3.75-7.5 mg by mouth daily. 1 tab daily except 0.5 tab on Tues and Thurs  No current facility-administered medications for this visit.   Facility-Administered Medications Ordered in Other Visits  Medication Dose Route Frequency Provider Last Rate Last Dose  . sodium chloride 0.9 % injection 10 mL  10 mL Intracatheter PRN Deatra Robinson, MD        History   Social History  . Marital Status: Legally Separated    Spouse Name: N/A    Number of Children: 4  . Years of Education: N/A   Occupational History  . Not on file.   Social History Main Topics  . Smoking status: Never Smoker   . Smokeless tobacco: Never Used  . Alcohol Use: No  . Drug Use: No  . Sexual Activity: No   Other Topics Concern  . Not on file   Social History Narrative    Family History  Problem Relation Age of Onset  . Diabetes Mother   . Stroke Mother   . Atrial fibrillation Mother   . Hypertension Mother   . Diabetes Maternal Grandmother   . Heart attack Father       Alvino Chapel, MD 09/03/2014, 10:47 AM

## 2014-09-05 LAB — CYTOLOGY - PAP

## 2014-09-07 ENCOUNTER — Telehealth: Payer: Self-pay | Admitting: Gynecologic Oncology

## 2014-09-07 NOTE — Telephone Encounter (Signed)
Message left for patient with pap smear results: negative.  Instructed to call for any questions or concerns.  

## 2014-10-06 ENCOUNTER — Encounter: Payer: Self-pay | Admitting: Gynecology

## 2014-10-08 ENCOUNTER — Telehealth: Payer: Self-pay | Admitting: *Deleted

## 2014-10-08 NOTE — Telephone Encounter (Signed)
Pt's daughter called in response to a request that pt's appointment be changed. Pt is scheduled to see Dr. Fermin Schwab 03/15/2015 which is a Friday. Pt and family would like a Monday appointment. Daughter was advised that Dr. Fermin Schwab didn't have any Monday clinic scheduled here in [place]. A appointment with a different provider on a Monday was offered. Pt's daughter declined and stated " we will keep that Friday appointment for now".

## 2014-12-24 ENCOUNTER — Telehealth: Payer: Self-pay | Admitting: Oncology

## 2014-12-24 ENCOUNTER — Ambulatory Visit: Admission: RE | Admit: 2014-12-24 | Payer: Medicare Other | Source: Ambulatory Visit | Admitting: Radiation Oncology

## 2014-12-24 ENCOUNTER — Encounter: Payer: Self-pay | Admitting: Radiation Oncology

## 2014-12-24 NOTE — Telephone Encounter (Signed)
Called [name] regarding her patient advice request email.  Per Isabel Caprice passed away over the weekend.  Gave condolences and advised [name] to call if she needs anything.

## 2015-01-20 DEATH — deceased

## 2015-03-08 ENCOUNTER — Encounter: Payer: Self-pay | Admitting: Gynecology

## 2015-03-15 ENCOUNTER — Ambulatory Visit: Payer: Medicare Other | Admitting: Gynecology

## 2016-03-09 ENCOUNTER — Other Ambulatory Visit: Payer: Self-pay | Admitting: Nurse Practitioner
